# Patient Record
Sex: Male | Born: 1955
Health system: Southern US, Community
[De-identification: ages and names within clinical notes are randomized; demographics above are authoritative.]

## PROBLEM LIST (undated history)

## (undated) DIAGNOSIS — E039 Hypothyroidism, unspecified: Secondary | ICD-10-CM

## (undated) HISTORY — PX: SHOULDER SURGERY: SHX246

## (undated) HISTORY — PX: APPENDECTOMY: SHX54

## (undated) HISTORY — DX: Hypothyroidism, unspecified: E03.9

## (undated) HISTORY — PX: MOUTH SURGERY: SHX715

## (undated) HISTORY — PX: BACK SURGERY: SHX140

---

## 2004-04-08 ENCOUNTER — Ambulatory Visit (HOSPITAL_COMMUNITY): Admission: RE | Admit: 2004-04-08 | Discharge: 2004-04-09 | Payer: Self-pay | Admitting: Neurosurgery

## 2004-05-30 ENCOUNTER — Encounter: Admission: RE | Admit: 2004-05-30 | Discharge: 2004-05-30 | Payer: Self-pay | Admitting: *Deleted

## 2015-12-26 LAB — HM COLONOSCOPY

## 2019-02-28 DIAGNOSIS — M12811 Other specific arthropathies, not elsewhere classified, right shoulder: Secondary | ICD-10-CM | POA: Diagnosis not present

## 2019-03-24 DIAGNOSIS — Z125 Encounter for screening for malignant neoplasm of prostate: Secondary | ICD-10-CM | POA: Diagnosis not present

## 2019-03-24 DIAGNOSIS — E038 Other specified hypothyroidism: Secondary | ICD-10-CM | POA: Diagnosis not present

## 2019-03-24 DIAGNOSIS — Z0000 Encounter for general adult medical examination without abnormal findings: Secondary | ICD-10-CM | POA: Diagnosis not present

## 2019-03-24 DIAGNOSIS — Z6829 Body mass index (BMI) 29.0-29.9, adult: Secondary | ICD-10-CM | POA: Diagnosis not present

## 2019-03-24 DIAGNOSIS — E782 Mixed hyperlipidemia: Secondary | ICD-10-CM | POA: Diagnosis not present

## 2019-03-30 ENCOUNTER — Other Ambulatory Visit: Payer: Self-pay

## 2019-06-14 ENCOUNTER — Other Ambulatory Visit: Payer: Self-pay

## 2019-06-14 MED ORDER — LEVOTHYROXINE SODIUM 75 MCG PO TABS: 75.0000 ug | ORAL_TABLET | Freq: Every day | ORAL | 0 refills | Status: DC

## 2019-06-14 NOTE — Telephone Encounter (Signed)
Needs refill on synthroid 75 mcg sent to [facility]

## 2019-09-21 ENCOUNTER — Other Ambulatory Visit: Payer: Self-pay

## 2019-09-21 MED ORDER — LEVOTHYROXINE SODIUM 75 MCG PO TABS: 75.0000 ug | ORAL_TABLET | Freq: Every day | ORAL | 0 refills | Status: DC

## 2019-11-01 DIAGNOSIS — M25562 Pain in left knee: Secondary | ICD-10-CM | POA: Diagnosis not present

## 2019-11-01 DIAGNOSIS — M1712 Unilateral primary osteoarthritis, left knee: Secondary | ICD-10-CM | POA: Diagnosis not present

## 2019-11-01 DIAGNOSIS — G8929 Other chronic pain: Secondary | ICD-10-CM | POA: Diagnosis not present

## 2019-11-28 ENCOUNTER — Other Ambulatory Visit: Payer: Self-pay

## 2019-11-28 MED ORDER — LEVOTHYROXINE SODIUM 75 MCG PO TABS: 75.0000 ug | ORAL_TABLET | Freq: Every day | ORAL | 0 refills | Status: DC

## 2019-12-01 DIAGNOSIS — R0600 Dyspnea, unspecified: Secondary | ICD-10-CM | POA: Diagnosis not present

## 2019-12-01 DIAGNOSIS — J209 Acute bronchitis, unspecified: Secondary | ICD-10-CM | POA: Diagnosis not present

## 2020-04-26 ENCOUNTER — Encounter: Payer: Self-pay | Admitting: Physician Assistant

## 2020-04-26 ENCOUNTER — Ambulatory Visit (INDEPENDENT_AMBULATORY_CARE_PROVIDER_SITE_OTHER): Payer: PPO | Admitting: Physician Assistant

## 2020-04-26 ENCOUNTER — Other Ambulatory Visit: Payer: Self-pay

## 2020-04-26 VITALS — BP 118/76 | HR 87 | Temp 97.5°F | Ht 67.0 in | Wt 185.0 lb

## 2020-04-26 DIAGNOSIS — E782 Mixed hyperlipidemia: Secondary | ICD-10-CM

## 2020-04-26 DIAGNOSIS — E038 Other specified hypothyroidism: Secondary | ICD-10-CM | POA: Diagnosis not present

## 2020-04-26 DIAGNOSIS — Z23 Encounter for immunization: Secondary | ICD-10-CM | POA: Diagnosis not present

## 2020-04-26 DIAGNOSIS — Z0000 Encounter for general adult medical examination without abnormal findings: Secondary | ICD-10-CM | POA: Diagnosis not present

## 2020-04-26 DIAGNOSIS — Z125 Encounter for screening for malignant neoplasm of prostate: Secondary | ICD-10-CM | POA: Diagnosis not present

## 2020-04-26 NOTE — Patient Instructions (Signed)

## 2020-04-26 NOTE — Progress Notes (Signed)
Subjective:  Patient ID: Craig Francis, male    DOB: 1955-06-25  Age: 64 y.o. MRN: [id number]  Chief Complaint  Patient presents with   Medicare wellness Follow up hypothyroidism        HPI Encounter for general adult medical examination without abnormal findings  Physical ("At Risk" items are starred): Patient's last physical exam was 1 year ago .   Growth percentile SmartLinks can only be used for patients less than 21 years old.   SDOH Screenings   Alcohol Screen: Not on file  Depression (PHQ2-9): Low Risk    PHQ-2 Score: 0  Financial Resource Strain: Not on file  Food Insecurity: Not on file  Housing: Not on file  Physical Activity: Not on file  Social Connections: Not on file  Stress: Not on file  Tobacco Use: Low Risk    Smoking Tobacco Use: Never Smoker   Smokeless Tobacco Use: Never Used  Transportation Needs: Not on file    Fall Risk  04/26/2020 03/30/2019  Falls in the past year? 0 0  Comment - Emmi Telephone Survey: data to providers prior to load  Number falls in past yr: 0 -  Injury with Fall? 0 -  Risk for fall due to : No Fall Risks -    Depression screen PHQ 2/9 04/26/2020  Decreased Interest 0  Down, Depressed, Hopeless 0  PHQ - 2 Score 0    Functional Status Survey: Is the patient deaf or have difficulty hearing?: No Does the patient have difficulty seeing, even when wearing glasses/contacts?: No Does the patient have difficulty concentrating, remembering, or making decisions?: No Does the patient have difficulty walking or climbing stairs?: No Does the patient have difficulty dressing or bathing?: No Does the patient have difficulty doing errands alone such as visiting a doctor's office or shopping?: No   Safety: reviewed ;  Patient wears a seat belt. Patient's home has smoke detectors and carbon monoxide detectors. Dental Care: biannual cleanings, brushes and flosses daily. Ophthalmology/Optometry: due Hearing loss:  none   Current Outpatient Medications on File Prior to Visit  Medication Sig Dispense Refill   levothyroxine (SYNTHROID) 75 MCG tablet Take 1 tablet (75 mcg total) by mouth daily before breakfast. 90 tablet 0   No current facility-administered medications on file prior to visit.    Social Hx   Social History   Socioeconomic History   Marital status: Married    Spouse name: Not on file   Number of children: Not on file   Years of education: Not on file   Highest education level: Not on file  Occupational History   Not on file  Tobacco Use   Smoking status: Never Smoker   Smokeless tobacco: Never Used  Vaping Use   Vaping Use: Never used  Substance and Sexual Activity   Alcohol use: Yes    Alcohol/week: 1.0 standard drink    Types: 1 Cans of beer per week   Drug use: Never   Sexual activity: Not on file  Other Topics Concern   Not on file  Social History Narrative   Not on file   Social Determinants of Health   Financial Resource Strain: Not on file  Food Insecurity: Not on file  Transportation Needs: Not on file  Physical Activity: Not on file  Stress: Not on file  Social Connections: Not on file   Past Medical History:  Diagnosis Date   Hypothyroid    History reviewed. No pertinent family history.  Review of Systems CONSTITUTIONAL: Negative for chills, fatigue, fever, unintentional weight gain and unintentional weight loss.  E/N/T: Negative for ear pain, nasal congestion and sore throat.  CARDIOVASCULAR: Negative for chest pain, dizziness, palpitations and pedal edema.  RESPIRATORY: Negative for recent cough and dyspnea.  GASTROINTESTINAL: Negative for abdominal pain, acid reflux symptoms, constipation, diarrhea, nausea and vomiting.  MSK: Negative for arthralgias and myalgias.  INTEGUMENTARY: Negative for rash.  NEUROLOGICAL: Negative for dizziness and headaches.  PSYCHIATRIC: Negative for sleep disturbance and to question depression screen.   Negative for depression, negative for anhedonia.       Objective:  BP 118/76 (BP Location: Left Arm, Patient Position: Sitting, Cuff Size: Normal)    Pulse 87    Temp (!) 97.5 F (36.4 C) (Temporal)    Ht 5\' 7"  (1.702 m)    Wt 185 lb (83.9 kg)    SpO2 96%    BMI 28.98 kg/m   BP/Weight [id number]  Systolic BP 123456  Diastolic BP 76  Wt. (Lbs) 185  BMI 28.98    Physical Exam  No results found for: WBC, HGB, HCT, PLT, GLUCOSE, CHOL, TRIG, HDL, LDLDIRECT, LDLCALC, ALT, AST, NA, K, CL, CREATININE, BUN, CO2, TSH, PSA, INR, GLUF, HGBA1C, MICROALBUR PHYSICAL EXAM:   VS: BP 118/76 (BP Location: Left Arm, Patient Position: Sitting, Cuff Size: Normal)    Pulse 87    Temp (!) 97.5 F (36.4 C) (Temporal)    Ht 5\' 7"  (1.702 m)    Wt 185 lb (83.9 kg)    SpO2 96%    BMI 28.98 kg/m   GEN: Well nourished, well developed, in no acute distress  Neck: no JVD or masses - no thyromegaly Cardiac: RRR; no murmurs, rubs, or gallops,no edema -  Respiratory:  normal respiratory rate and pattern with no distress - normal breath sounds with no rales, rhonchi, wheezes or rubs   Skin: warm and dry, no rash  Neuro:  Alert and Oriented x 3,- CN II-Xii grossly intact Psych: euthymic mood, appropriate affect and demeanor    Assessment & Plan:  1. Annual physical exam - CBC with Differential/Platelet - Comprehensive metabolic panel - TSH - Lipid panel - PSA  2. Adult onset hypothyroidism - TSH  3. Mixed hyperlipidemia - CBC with Differential/Platelet - Comprehensive metabolic panel - Lipid panel  4. Prostate cancer screening - PSA  5. Need for prophylactic vaccination against Streptococcus pneumoniae (pneumococcus) - Pneumococcal conjugate vaccine 13-valent    No orders of the defined types were placed in this encounter.   These are the goals we discussed: Goals   None      This is a list of the screening recommended for you and due dates:  Health Maintenance  Topic Date Due     Hepatitis C: One time screening is recommended by Center for Disease Control  (CDC) for  adults born from [birth year] through 1967.   Never done   Colon Cancer Screening  12/25/2020   Pneumonia vaccines (2 of 2 - PPSV23) 04/26/2021   Tetanus Vaccine  03/18/2028   Flu Shot  Completed   COVID-19 Vaccine  Completed      AN INDIVIDUALIZED CARE PLAN: was established or reinforced today.   SELF MANAGEMENT: The patient and I together assessed ways to personally work towards obtaining the recommended goals  Support needs The patient and/or family needs were assessed and services were offered and not necessary at this time.    Follow-up: Return in about 1 year (around 04/26/2021)  for fasting physical.  Yetta Flock [facility] [phone]

## 2020-04-27 LAB — CBC WITH DIFFERENTIAL/PLATELET
Basophils Absolute: 0.1 10*3/uL (ref 0.0–0.2)
Basos: 1 %
EOS (ABSOLUTE): 0.2 10*3/uL (ref 0.0–0.4)
Eos: 3 %
Hematocrit: 46.2 % (ref 37.5–51.0)
Hemoglobin: 16.4 g/dL (ref 13.0–17.7)
Immature Grans (Abs): 0 10*3/uL (ref 0.0–0.1)
Immature Granulocytes: 0 %
Lymphocytes Absolute: 2.2 10*3/uL (ref 0.7–3.1)
Lymphs: 33 %
MCH: 31.8 pg (ref 26.6–33.0)
MCHC: 35.5 g/dL (ref 31.5–35.7)
MCV: 90 fL (ref 79–97)
Monocytes Absolute: 0.5 10*3/uL (ref 0.1–0.9)
Monocytes: 8 %
Neutrophils Absolute: 3.7 10*3/uL (ref 1.4–7.0)
Neutrophils: 55 %
Platelets: 265 10*3/uL (ref 150–450)
RBC: 5.16 x10E6/uL (ref 4.14–5.80)
RDW: 11.2 % — ABNORMAL LOW (ref 11.6–15.4)
WBC: 6.7 10*3/uL (ref 3.4–10.8)

## 2020-04-27 LAB — COMPREHENSIVE METABOLIC PANEL
ALT: 17 IU/L (ref 0–44)
AST: 20 IU/L (ref 0–40)
Albumin/Globulin Ratio: 1.7 (ref 1.2–2.2)
Albumin: 4.7 g/dL (ref 3.8–4.8)
Alkaline Phosphatase: 50 IU/L (ref 44–121)
BUN/Creatinine Ratio: 11 (ref 10–24)
BUN: 11 mg/dL (ref 8–27)
Bilirubin Total: 0.8 mg/dL (ref 0.0–1.2)
CO2: 22 mmol/L (ref 20–29)
Calcium: 9.8 mg/dL (ref 8.6–10.2)
Chloride: 101 mmol/L (ref 96–106)
Creatinine, Ser: 0.97 mg/dL (ref 0.76–1.27)
GFR calc Af Amer: 94 mL/min/{1.73_m2} (ref >59)
GFR calc non Af Amer: 81 mL/min/{1.73_m2} (ref >59)
Globulin, Total: 2.7 g/dL (ref 1.5–4.5)
Glucose: 104 mg/dL — ABNORMAL HIGH (ref 65–99)
Potassium: 4.4 mmol/L (ref 3.5–5.2)
Sodium: 139 mmol/L (ref 134–144)
Total Protein: 7.4 g/dL (ref 6.0–8.5)

## 2020-04-27 LAB — LIPID PANEL
Chol/HDL Ratio: 3.6 ratio (ref 0.0–5.0)
Cholesterol, Total: 218 mg/dL — ABNORMAL HIGH (ref 100–199)
HDL: 61 mg/dL (ref >39)
LDL Chol Calc (NIH): 139 mg/dL — ABNORMAL HIGH (ref 0–99)
Triglycerides: 103 mg/dL (ref 0–149)
VLDL Cholesterol Cal: 18 mg/dL (ref 5–40)

## 2020-04-27 LAB — TSH: TSH: 2.91 u[IU]/mL (ref 0.450–4.500)

## 2020-04-27 LAB — PSA: Prostate Specific Ag, Serum: 3.5 ng/mL (ref 0.0–4.0)

## 2020-04-27 LAB — CARDIOVASCULAR RISK ASSESSMENT

## 2020-05-01 ENCOUNTER — Other Ambulatory Visit: Payer: Self-pay

## 2020-05-01 MED ORDER — LEVOTHYROXINE SODIUM 75 MCG PO TABS: 75.0000 ug | ORAL_TABLET | Freq: Every day | ORAL | 0 refills | Status: DC

## 2020-05-04 ENCOUNTER — Other Ambulatory Visit: Payer: Self-pay | Admitting: Physician Assistant

## 2020-06-04 ENCOUNTER — Ambulatory Visit (INDEPENDENT_AMBULATORY_CARE_PROVIDER_SITE_OTHER): Payer: PPO | Admitting: Physician Assistant

## 2020-06-04 ENCOUNTER — Other Ambulatory Visit: Payer: Self-pay

## 2020-06-04 ENCOUNTER — Encounter: Payer: Self-pay | Admitting: Physician Assistant

## 2020-06-04 VITALS — BP 118/72 | HR 94 | Temp 96.9°F | Ht 67.0 in | Wt 184.4 lb

## 2020-06-04 DIAGNOSIS — K5730 Diverticulosis of large intestine without perforation or abscess without bleeding: Secondary | ICD-10-CM | POA: Diagnosis not present

## 2020-06-04 DIAGNOSIS — R102 Pelvic and perineal pain: Secondary | ICD-10-CM | POA: Diagnosis not present

## 2020-06-04 DIAGNOSIS — K449 Diaphragmatic hernia without obstruction or gangrene: Secondary | ICD-10-CM | POA: Diagnosis not present

## 2020-06-04 DIAGNOSIS — K8020 Calculus of gallbladder without cholecystitis without obstruction: Secondary | ICD-10-CM | POA: Diagnosis not present

## 2020-06-04 DIAGNOSIS — R1031 Right lower quadrant pain: Secondary | ICD-10-CM | POA: Diagnosis not present

## 2020-06-04 DIAGNOSIS — R300 Dysuria: Secondary | ICD-10-CM

## 2020-06-04 LAB — POCT URINALYSIS DIP (CLINITEK)
Bilirubin, UA: NEGATIVE
Blood, UA: NEGATIVE
Glucose, UA: NEGATIVE mg/dL
Ketones, POC UA: NEGATIVE mg/dL
Nitrite, UA: NEGATIVE
Spec Grav, UA: 1.025 (ref 1.010–1.025)
Urobilinogen, UA: 0.2 E.U./dL
pH, UA: 6 (ref 5.0–8.0)

## 2020-06-04 MED ORDER — METRONIDAZOLE 500 MG PO TABS: 500.0000 mg | ORAL_TABLET | Freq: Two times a day (BID) | ORAL | 0 refills | Status: DC

## 2020-06-04 MED ORDER — CIPROFLOXACIN HCL 500 MG PO TABS: 500.0000 mg | ORAL_TABLET | Freq: Two times a day (BID) | ORAL | 0 refills | Status: DC

## 2020-06-04 NOTE — Progress Notes (Signed)
Acute Office Visit  Subjective:    Patient ID: Craig Francis, male    DOB: 1956-01-28, 65 y.o.   MRN: [id number]  Chief Complaint  Patient presents with  . Abdominal Pain    HPI Patient is in today for abdominal pain - pt states that 8 days ago he began having pain in his lower abdomen, felt gassy, and uncomfortable - states that he seemed a bit constipated - after about 2 days he began to feel better but then this weekend the pain got worse again -  He has had a small amount of dysuria - no hematuria Denies nausea, vomiting, or diarrhea but his appetite has been decreased He is having small amount of bowel movements - no melena or hematochezia Denies fever, cough, or congestion  Past Medical History:  Diagnosis Date  . Hypothyroid     History reviewed. No pertinent surgical history.  History reviewed. No pertinent family history.  Social History   Socioeconomic History  . Marital status: Married    Spouse name: Not on file  . Number of children: Not on file  . Years of education: Not on file  . Highest education level: Not on file  Occupational History  . Not on file  Tobacco Use  . Smoking status: Never Smoker  . Smokeless tobacco: Never Used  Vaping Use  . Vaping Use: Never used  Substance and Sexual Activity  . Alcohol use: Yes    Alcohol/week: 1.0 standard drink    Types: 1 Cans of beer per week  . Drug use: Never  . Sexual activity: Not on file  Other Topics Concern  . Not on file  Social History Narrative  . Not on file   Social Determinants of Health   Financial Resource Strain: Not on file  Food Insecurity: Not on file  Transportation Needs: Not on file  Physical Activity: Not on file  Stress: Not on file  Social Connections: Not on file  Intimate Partner Violence: Not on file    Outpatient Medications Prior to Visit  Medication Sig Dispense Refill  . levothyroxine (SYNTHROID) 75 MCG tablet TAKE 1 TABLET(75 MCG) BY MOUTH DAILY BEFORE  AND BREAKFAST 90 tablet 0   No facility-administered medications prior to visit.    No Known Allergies  Review of Systems CONSTITUTIONAL:see J{O E/N/T: Negative for ear pain, nasal congestion and sore throat.  CARDIOVASCULAR: Negative for chest pain, dizziness, palpitations and pedal edema.  RESPIRATORY: Negative for recent cough and dyspnea.  GASTROINTESTINAL: see HPI INTEGUMENTARY: Negative for rash.          Objective:    Physical Exam PHYSICAL EXAM:   VS: BP 118/72 (BP Location: Left Arm, Patient Position: Sitting, Cuff Size: Normal)   Pulse 94   Temp (!) 96.9 F (36.1 C) (Temporal)   Ht 5\' 7"  (1.702 m)   Wt 184 lb 6.4 oz (83.6 kg)   SpO2 96%   BMI 28.88 kg/m   GEN: Well nourished, well developed, in no acute distress  Cardiac: RRR; no murmurs, rubs, or gallops,no edema -  Respiratory:  normal respiratory rate and pattern with no distress - normal breath sounds with no rales, rhonchi, wheezes or rubs GI: hyperactive bowel sounds noted - moderate tenderness to lower abdomen and suprapubic area --- testicular exam normal and no sign of hernia Skin: warm and dry, no rash  Psych: euthymic mood, appropriate affect and demeanor Office Visit on 06/04/2020  Component Date Value Ref Range Status  .  Color, UA 06/04/2020 yellow  yellow Final  . Clarity, UA 06/04/2020 clear  clear Final  . Glucose, UA 06/04/2020 negative  negative mg/dL Final  . Bilirubin, UA 06/04/2020 negative  negative Final  . Ketones, POC UA 06/04/2020 negative  negative mg/dL Final  . Spec Grav, UA 06/04/2020 1.025  1.010 - 1.025 Final  . Blood, UA 06/04/2020 negative  negative Final  . pH, UA 06/04/2020 6.0  5.0 - 8.0 Final  . POC PROTEIN,UA 06/04/2020 trace  negative, trace Final  . Urobilinogen, UA 06/04/2020 0.2  0.2 or 1.0 E.U./dL Final  . Nitrite, UA 06/04/2020 Negative  Negative Final  . Leukocytes, UA 06/04/2020 Trace* Negative Final    BP 118/72 (BP Location: Left Arm, Patient Position:  Sitting, Cuff Size: Normal)   Pulse 94   Temp (!) 96.9 F (36.1 C) (Temporal)   Ht 5\' 7"  (1.702 m)   Wt 184 lb 6.4 oz (83.6 kg)   SpO2 96%   BMI 28.88 kg/m  Wt Readings from Last 3 Encounters:  06/04/20 184 lb 6.4 oz (83.6 kg)  04/26/20 185 lb (83.9 kg)    Health Maintenance Due  Topic Date Due  . Hepatitis C Screening  Never done    There are no preventive care reminders to display for this patient.   Lab Results  Component Value Date   TSH 2.910 04/26/2020   Lab Results  Component Value Date   WBC 6.7 04/26/2020   HGB 16.4 04/26/2020   HCT 46.2 04/26/2020   MCV 90 04/26/2020   PLT 265 04/26/2020   Lab Results  Component Value Date   NA 139 04/26/2020   K 4.4 04/26/2020   CO2 22 04/26/2020   GLUCOSE 104 (H) 04/26/2020   BUN 11 04/26/2020   CREATININE 0.97 04/26/2020   BILITOT 0.8 04/26/2020   ALKPHOS 50 04/26/2020   AST 20 04/26/2020   ALT 17 04/26/2020   PROT 7.4 04/26/2020   ALBUMIN 4.7 04/26/2020   CALCIUM 9.8 04/26/2020   Lab Results  Component Value Date   CHOL 218 (H) 04/26/2020   Lab Results  Component Value Date   HDL 61 04/26/2020   Lab Results  Component Value Date   LDLCALC 139 (H) 04/26/2020   Lab Results  Component Value Date   TRIG 103 04/26/2020   Lab Results  Component Value Date   CHOLHDL 3.6 04/26/2020   No results found for: HGBA1C     Assessment & Plan:  1. Right lower quadrant abdominal pain - CBC with Differential/Platelet - Comprehensive metabolic panel - CT Abdomen Pelvis W Contrast  2. Dysuria - POCT URINALYSIS DIP (CLINITEK) - Urine Culture    Meds ordered this encounter  Medications  . ciprofloxacin (CIPRO) 500 MG tablet    Sig: Take 1 tablet (500 mg total) by mouth 2 (two) times daily.    Dispense:  20 tablet    Refill:  0    Order Specific Question:   Supervising Provider    AnswerRochel Brome [id number]  . metroNIDAZOLE (FLAGYL) 500 MG tablet    Sig: Take 1 tablet (500 mg total) by mouth 2  (two) times daily.    Dispense:  20 tablet    Refill:  0    Order Specific Question:   Supervising Provider    AnswerShelton Silvas    Orders Placed This Encounter  Procedures  . Urine Culture  . CT Abdomen Pelvis W Contrast  . CBC  with Differential/Platelet  . Comprehensive metabolic panel  . POCT URINALYSIS DIP (CLINITEK)    PT NOTIFIED LABWORK NORMAL - CT SHOWED DIVERTICULITIS - RX SENT FOR CIPRO AND FLAGYL - TO FOLLOW UP IF ANY SYMPTOMS CHANGE OR WORSEN Follow-up: Return if symptoms worsen or fail to improve.  An After Visit Summary was printed and given to the patient.  Yetta Flock [facility] [phone]

## 2020-06-06 LAB — URINE CULTURE: Organism ID, Bacteria: NO GROWTH

## 2020-07-30 ENCOUNTER — Emergency Department (HOSPITAL_COMMUNITY): Payer: PPO | Attending: Emergency Medicine

## 2020-07-30 ENCOUNTER — Emergency Department (HOSPITAL_COMMUNITY): Payer: PPO

## 2020-07-30 ENCOUNTER — Emergency Department (HOSPITAL_COMMUNITY)
Admission: EM | Admit: 2020-07-30 | Discharge: 2020-07-30 | Disposition: A | Discharge status: Home / Self Care | Payer: Worker's Compensation | Attending: Emergency Medicine | Admitting: Emergency Medicine

## 2020-07-30 ENCOUNTER — Other Ambulatory Visit: Payer: Self-pay

## 2020-07-30 ENCOUNTER — Encounter (HOSPITAL_COMMUNITY): Payer: Self-pay | Admitting: *Deleted

## 2020-07-30 DIAGNOSIS — R404 Transient alteration of awareness: Secondary | ICD-10-CM | POA: Diagnosis not present

## 2020-07-30 DIAGNOSIS — Z23 Encounter for immunization: Secondary | ICD-10-CM | POA: Diagnosis not present

## 2020-07-30 DIAGNOSIS — S51811A Laceration without foreign body of right forearm, initial encounter: Secondary | ICD-10-CM | POA: Diagnosis not present

## 2020-07-30 DIAGNOSIS — M2634 Vertical displacement of fully erupted tooth or teeth: Secondary | ICD-10-CM

## 2020-07-30 DIAGNOSIS — Z20822 Contact with and (suspected) exposure to covid-19: Secondary | ICD-10-CM | POA: Diagnosis not present

## 2020-07-30 DIAGNOSIS — W228XXA Striking against or struck by other objects, initial encounter: Secondary | ICD-10-CM | POA: Diagnosis not present

## 2020-07-30 DIAGNOSIS — S0993XA Unspecified injury of face, initial encounter: Secondary | ICD-10-CM | POA: Diagnosis present

## 2020-07-30 DIAGNOSIS — S01511A Laceration without foreign body of lip, initial encounter: Secondary | ICD-10-CM | POA: Diagnosis not present

## 2020-07-30 DIAGNOSIS — M2630 Unspecified anomaly of tooth position of fully erupted tooth or teeth: Secondary | ICD-10-CM | POA: Insufficient documentation

## 2020-07-30 DIAGNOSIS — R0902 Hypoxemia: Secondary | ICD-10-CM | POA: Diagnosis not present

## 2020-07-30 DIAGNOSIS — R4020 Unspecified coma: Secondary | ICD-10-CM | POA: Diagnosis not present

## 2020-07-30 DIAGNOSIS — S199XXA Unspecified injury of neck, initial encounter: Secondary | ICD-10-CM | POA: Diagnosis not present

## 2020-07-30 DIAGNOSIS — Y990 Civilian activity done for income or pay: Secondary | ICD-10-CM | POA: Diagnosis not present

## 2020-07-30 DIAGNOSIS — S0240CA Maxillary fracture, right side, initial encounter for closed fracture: Secondary | ICD-10-CM | POA: Insufficient documentation

## 2020-07-30 DIAGNOSIS — E039 Hypothyroidism, unspecified: Secondary | ICD-10-CM | POA: Diagnosis not present

## 2020-07-30 DIAGNOSIS — S02401B Maxillary fracture, unspecified, initial encounter for open fracture: Secondary | ICD-10-CM

## 2020-07-30 DIAGNOSIS — R9431 Abnormal electrocardiogram [ECG] [EKG]: Secondary | ICD-10-CM | POA: Diagnosis not present

## 2020-07-30 DIAGNOSIS — S0990XA Unspecified injury of head, initial encounter: Secondary | ICD-10-CM | POA: Diagnosis not present

## 2020-07-30 DIAGNOSIS — S0081XA Abrasion of other part of head, initial encounter: Secondary | ICD-10-CM

## 2020-07-30 DIAGNOSIS — S02401A Maxillary fracture, unspecified, initial encounter for closed fracture: Secondary | ICD-10-CM | POA: Diagnosis not present

## 2020-07-30 DIAGNOSIS — Z79899 Other long term (current) drug therapy: Secondary | ICD-10-CM | POA: Insufficient documentation

## 2020-07-30 DIAGNOSIS — R58 Hemorrhage, not elsewhere classified: Secondary | ICD-10-CM | POA: Diagnosis not present

## 2020-07-30 LAB — CBC WITH DIFFERENTIAL/PLATELET
Abs Immature Granulocytes: 0.06 10*3/uL (ref 0.00–0.07)
Basophils Absolute: 0.1 10*3/uL (ref 0.0–0.1)
Basophils Relative: 0 %
Eosinophils Absolute: 0 10*3/uL (ref 0.0–0.5)
Eosinophils Relative: 0 %
HCT: 43.6 % (ref 39.0–52.0)
Hemoglobin: 15.2 g/dL (ref 13.0–17.0)
Immature Granulocytes: 0 %
Lymphocytes Relative: 8 %
Lymphs Abs: 1.1 10*3/uL (ref 0.7–4.0)
MCH: 32.3 pg (ref 26.0–34.0)
MCHC: 34.9 g/dL (ref 30.0–36.0)
MCV: 92.8 fL (ref 80.0–100.0)
Monocytes Absolute: 0.6 10*3/uL (ref 0.1–1.0)
Monocytes Relative: 5 %
Neutro Abs: 12 10*3/uL — ABNORMAL HIGH (ref 1.7–7.7)
Neutrophils Relative %: 87 %
Platelets: 210 10*3/uL (ref 150–400)
RBC: 4.7 MIL/uL (ref 4.22–5.81)
RDW: 12.3 % (ref 11.5–15.5)
WBC: 13.9 10*3/uL — ABNORMAL HIGH (ref 4.0–10.5)
nRBC: 0 % (ref 0.0–0.2)

## 2020-07-30 LAB — RESP PANEL BY RT-PCR (FLU A&B, COVID) ARPGX2
Influenza A by PCR: NEGATIVE
Influenza B by PCR: NEGATIVE
SARS Coronavirus 2 by RT PCR: NEGATIVE

## 2020-07-30 LAB — BASIC METABOLIC PANEL
Anion gap: 7 (ref 5–15)
BUN: 10 mg/dL (ref 8–23)
CO2: 23 mmol/L (ref 22–32)
Calcium: 9 mg/dL (ref 8.9–10.3)
Chloride: 105 mmol/L (ref 98–111)
Creatinine, Ser: 0.81 mg/dL (ref 0.61–1.24)
GFR, Estimated: >60 mL/min (ref >60)
Glucose, Bld: 112 mg/dL — ABNORMAL HIGH (ref 70–99)
Potassium: 3.9 mmol/L (ref 3.5–5.1)
Sodium: 135 mmol/L (ref 135–145)

## 2020-07-30 MED ORDER — TETANUS-DIPHTH-ACELL PERTUSSIS 5-2.5-18.5 LF-MCG/0.5 IM SUSY
0.5000 mL | PREFILLED_SYRINGE | Freq: Once | INTRAMUSCULAR | Status: AC
  Administered 2020-07-30: 0.5 mL via INTRAMUSCULAR
  Filled 2020-07-30: qty 0.5

## 2020-07-30 MED ORDER — AMOXICILLIN-POT CLAVULANATE 875-125 MG PO TABS
1.0000 | ORAL_TABLET | Freq: Once | ORAL | Status: AC
  Administered 2020-07-30: 1 via ORAL
  Filled 2020-07-30: qty 1

## 2020-07-30 MED ORDER — OXYCODONE-ACETAMINOPHEN 5-325 MG PO TABS: 1.0000 | ORAL_TABLET | ORAL | 0 refills | Status: DC | PRN

## 2020-07-30 MED ORDER — AMOXICILLIN-POT CLAVULANATE 875-125 MG PO TABS: 1.0000 | ORAL_TABLET | Freq: Two times a day (BID) | ORAL | 0 refills | Status: DC

## 2020-07-30 MED ORDER — LIDOCAINE-EPINEPHRINE 1 %-1:100000 IJ SOLN
10.0000 mL | Freq: Once | INTRAMUSCULAR | Status: AC
  Administered 2020-07-30: 10 mL via INTRADERMAL
  Filled 2020-07-30: qty 1

## 2020-07-30 NOTE — ED Triage Notes (Signed)
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  The cable holding the pipe together broke, the pipe fell down to the ground, bounced up and hit him in the face.  LOC on sight with snoring rr - unknown how long, but possibly 3 min.  Oral trauma noted with missing parital. Pt was altered, but is now ao x 4.  Handgun given to security [place] the bridge.   VS stable.    cbg 113

## 2020-07-30 NOTE — ED Notes (Signed)
E-signature pad unavailable at time of pt discharge. This RN discussed discharge materials with pt and answered all pt questions. Pt stated understanding of discharge material. ? ?

## 2020-07-30 NOTE — ED Provider Notes (Signed)
[facility] EMERGENCY DEPARTMENT Provider Note   CSN: [id number] Arrival date & time: 07/30/20  1509     History Chief Complaint  Patient presents with  . Facial Injury    Craig Francis is a 65 y.o. male.  The history is provided by the patient. No language interpreter was used.  Facial Injury Mechanism of injury:  Direct blow Location:  Mouth and chin Mouth location:  Lip(s) Time since incident:  2 hours Pain details:    Quality:  Aching   Severity:  Moderate   Timing:  Constant   Progression:  Unchanged Relieved by:  Nothing Worsened by:  Nothing Ineffective treatments:  None tried Associated symptoms: headaches and loss of consciousness   Associated symptoms: no congestion, no difficulty breathing, no double vision, no epistaxis, no nausea, no neck pain, no rhinorrhea, no vomiting and no wheezing        Past Medical History:  Diagnosis Date  . Hypothyroid     Patient Active Problem List   Diagnosis Date Noted  . Right lower quadrant abdominal pain 06/04/2020  . Dysuria 06/04/2020  . Annual physical exam 04/26/2020  . Need for prophylactic vaccination against Streptococcus pneumoniae (pneumococcus) 04/26/2020  . Prostate cancer screening 04/26/2020  . Adult onset hypothyroidism 04/26/2020    History reviewed. No pertinent surgical history.     No family history on file.  Social History   Tobacco Use  . Smoking status: Never Smoker  . Smokeless tobacco: Never Used  Vaping Use  . Vaping Use: Never used  Substance Use Topics  . Alcohol use: Yes    Alcohol/week: 1.0 standard drink    Types: 1 Cans of beer per week  . Drug use: Never    Home Medications Prior to Admission medications   Medication Sig Start Date End Date Taking? Authorizing Provider  ciprofloxacin (CIPRO) 500 MG tablet Take 1 tablet (500 mg total) by mouth 2 (two) times daily. 06/04/20   Marge Duncans, PA-C  levothyroxine (SYNTHROID) 75 MCG tablet TAKE 1  TABLET(75 MCG) BY MOUTH DAILY BEFORE AND BREAKFAST 05/05/20   Marge Duncans, PA-C  metroNIDAZOLE (FLAGYL) 500 MG tablet Take 1 tablet (500 mg total) by mouth 2 (two) times daily. 06/04/20   Marge Duncans, PA-C    Allergies    Patient has no known allergies.  Review of Systems   Review of Systems  Constitutional: Negative for chills, fatigue and fever.  HENT: Negative for congestion, nosebleeds, rhinorrhea and voice change.   Eyes: Negative for double vision.  Respiratory: Negative for cough, chest tightness, shortness of breath and wheezing.   Cardiovascular: Negative for chest pain and palpitations.  Gastrointestinal: Negative for nausea and vomiting.  Musculoskeletal: Negative for back pain, neck pain and neck stiffness.  Skin: Positive for wound.  Neurological: Positive for loss of consciousness and headaches. Negative for dizziness, speech difficulty, weakness, light-headedness and numbness.  Psychiatric/Behavioral: Negative for agitation and confusion.  All other systems reviewed and are negative.   Physical Exam Updated Vital Signs BP (!) 162/84   Pulse 83   Temp 97.7 F (36.5 C) (Axillary)   Resp 15   Ht 5\' 7"  (1.702 m)   Wt 85.7 kg   SpO2 96%   BMI 29.60 kg/m   Physical Exam Vitals and nursing note reviewed.  Constitutional:      Appearance: He is well-developed. He is not ill-appearing.  HENT:     Head: Abrasion present.      Nose: No  congestion or rhinorrhea.     Mouth/Throat:     Mouth: Mucous membranes are moist. Injury and lacerations present.     Dentition: Abnormal dentition. Dental tenderness present.     Pharynx: No oropharyngeal exudate or posterior oropharyngeal erythema.   Eyes:     Extraocular Movements: Extraocular movements intact.     Conjunctiva/sclera: Conjunctivae normal.     Pupils: Pupils are equal, round, and reactive to light.  Neck:     Comments: In c collar Cardiovascular:     Rate and Rhythm: Normal rate and regular rhythm.      Pulses: Normal pulses.     Heart sounds: No murmur heard.   Pulmonary:     Effort: Pulmonary effort is normal. No respiratory distress.     Breath sounds: Normal breath sounds.  Abdominal:     Palpations: Abdomen is soft.     Tenderness: There is no abdominal tenderness. There is no guarding or rebound.  Musculoskeletal:        General: Tenderness present.       Arms:     Cervical back: Neck supple. No tenderness.  Skin:    General: Skin is warm and dry.     Capillary Refill: Capillary refill takes less than 2 seconds.     Findings: No erythema.  Neurological:     General: No focal deficit present.     Mental Status: He is alert.  Psychiatric:        Mood and Affect: Mood normal.              After repair:   ED Results / Procedures / Treatments   Labs (all labs ordered are listed, but only abnormal results are displayed) Labs Reviewed  CBC WITH DIFFERENTIAL/PLATELET - Abnormal; Notable for the following components:      Result Value   WBC 13.9 (*)    Neutro Abs 12.0 (*)    All other components within normal limits  BASIC METABOLIC PANEL - Abnormal; Notable for the following components:   Glucose, Bld 112 (*)    All other components within normal limits  RESP PANEL BY RT-PCR (FLU A&B, COVID) [id number]    EKG EKG Interpretation  Date/Time:  Monday July 30 2020 15:23:00 EDT Ventricular Rate:  86 PR Interval:    QRS Duration: 93 QT Interval:  389 QTC Calculation: 466 R Axis:   -19 Text Interpretation: Sinus rhythm Borderline left axis deviation RSR' in V1 or V2, probably normal variant When comapred to prior, similar apperance. No STEMI Confirmed by Antony Blackbird [phone]) on 07/30/2020 3:34:17 PM   Radiology CT Head Wo Contrast  Result Date: 07/30/2020 CLINICAL DATA:  Hit with pipe. EXAM: CT HEAD WITHOUT CONTRAST CT MAXILLOFACIAL WITHOUT CONTRAST CT CERVICAL SPINE WITHOUT CONTRAST TECHNIQUE: Multidetector CT imaging of the head, cervical spine, and  maxillofacial structures were performed using the standard protocol without intravenous contrast. Multiplanar CT image reconstructions of the cervical spine and maxillofacial structures were also generated. COMPARISON:  None. FINDINGS: CT HEAD FINDINGS Brain: No evidence of acute infarction, hemorrhage, hydrocephalus, extra-axial collection or mass lesion/mass effect. Vascular: No hyperdense vessel or unexpected calcification. Skull: Normal. Negative for fracture or focal lesion. Other: None. CT MAXILLOFACIAL FINDINGS Osseous: The mandible is unremarkable. However, the right lateral incisor of the maxilla appears to have been displaced slightly superiorly and has resulted in a nondisplaced fracture involving the right anterior maxillary wall. Fluid is noted in the right maxillary sinus consistent with hemorrhage. Orbits: Negative. No traumatic  or inflammatory finding. Sinuses: As noted above, fluid is noted in right maxillary sinus which most likely is due to nondisplaced fracture involving right anterior maxillary wall. Soft tissues: Large amount of contusion and soft tissue gas is seen overlying the right mandibular and maxillary region consistent with traumatic injury. CT CERVICAL SPINE FINDINGS Alignment: Normal. Skull base and vertebrae: No acute fracture. No primary bone lesion or focal pathologic process. Soft tissues and spinal canal: No prevertebral fluid or swelling. No visible canal hematoma. Disc levels:  Status post surgical anterior fusion of C6-7. Upper chest: Negative. Other: None. IMPRESSION: 1. Normal head CT. 2. However, the right lateral incisor of the maxilla appears to have been displaced slightly superiorly and has resulted in a nondisplaced fracture involving the right anterior maxillary wall. Fluid is noted in the right maxillary sinus consistent with hemorrhage. Large amount of contusion and soft tissue gas is seen overlying the right mandibular and maxillary region consistent with  traumatic injury. 3. No acute abnormality seen in the cervical spine. Electronically Signed   By: Marijo Conception M.D.   On: 07/30/2020 18:49   CT Cervical Spine Wo Contrast  Result Date: 07/30/2020 CLINICAL DATA:  Hit with pipe. EXAM: CT HEAD WITHOUT CONTRAST CT MAXILLOFACIAL WITHOUT CONTRAST CT CERVICAL SPINE WITHOUT CONTRAST TECHNIQUE: Multidetector CT imaging of the head, cervical spine, and maxillofacial structures were performed using the standard protocol without intravenous contrast. Multiplanar CT image reconstructions of the cervical spine and maxillofacial structures were also generated. COMPARISON:  None. FINDINGS: CT HEAD FINDINGS Brain: No evidence of acute infarction, hemorrhage, hydrocephalus, extra-axial collection or mass lesion/mass effect. Vascular: No hyperdense vessel or unexpected calcification. Skull: Normal. Negative for fracture or focal lesion. Other: None. CT MAXILLOFACIAL FINDINGS Osseous: The mandible is unremarkable. However, the right lateral incisor of the maxilla appears to have been displaced slightly superiorly and has resulted in a nondisplaced fracture involving the right anterior maxillary wall. Fluid is noted in the right maxillary sinus consistent with hemorrhage. Orbits: Negative. No traumatic or inflammatory finding. Sinuses: As noted above, fluid is noted in right maxillary sinus which most likely is due to nondisplaced fracture involving right anterior maxillary wall. Soft tissues: Large amount of contusion and soft tissue gas is seen overlying the right mandibular and maxillary region consistent with traumatic injury. CT CERVICAL SPINE FINDINGS Alignment: Normal. Skull base and vertebrae: No acute fracture. No primary bone lesion or focal pathologic process. Soft tissues and spinal canal: No prevertebral fluid or swelling. No visible canal hematoma. Disc levels:  Status post surgical anterior fusion of C6-7. Upper chest: Negative. Other: None. IMPRESSION: 1. Normal  head CT. 2. However, the right lateral incisor of the maxilla appears to have been displaced slightly superiorly and has resulted in a nondisplaced fracture involving the right anterior maxillary wall. Fluid is noted in the right maxillary sinus consistent with hemorrhage. Large amount of contusion and soft tissue gas is seen overlying the right mandibular and maxillary region consistent with traumatic injury. 3. No acute abnormality seen in the cervical spine. Electronically Signed   By: Marijo Conception M.D.   On: 07/30/2020 18:49   CT Maxillofacial Wo Contrast  Result Date: 07/30/2020 CLINICAL DATA:  Hit with pipe. EXAM: CT HEAD WITHOUT CONTRAST CT MAXILLOFACIAL WITHOUT CONTRAST CT CERVICAL SPINE WITHOUT CONTRAST TECHNIQUE: Multidetector CT imaging of the head, cervical spine, and maxillofacial structures were performed using the standard protocol without intravenous contrast. Multiplanar CT image reconstructions of the cervical spine and maxillofacial structures were  also generated. COMPARISON:  None. FINDINGS: CT HEAD FINDINGS Brain: No evidence of acute infarction, hemorrhage, hydrocephalus, extra-axial collection or mass lesion/mass effect. Vascular: No hyperdense vessel or unexpected calcification. Skull: Normal. Negative for fracture or focal lesion. Other: None. CT MAXILLOFACIAL FINDINGS Osseous: The mandible is unremarkable. However, the right lateral incisor of the maxilla appears to have been displaced slightly superiorly and has resulted in a nondisplaced fracture involving the right anterior maxillary wall. Fluid is noted in the right maxillary sinus consistent with hemorrhage. Orbits: Negative. No traumatic or inflammatory finding. Sinuses: As noted above, fluid is noted in right maxillary sinus which most likely is due to nondisplaced fracture involving right anterior maxillary wall. Soft tissues: Large amount of contusion and soft tissue gas is seen overlying the right mandibular and maxillary  region consistent with traumatic injury. CT CERVICAL SPINE FINDINGS Alignment: Normal. Skull base and vertebrae: No acute fracture. No primary bone lesion or focal pathologic process. Soft tissues and spinal canal: No prevertebral fluid or swelling. No visible canal hematoma. Disc levels:  Status post surgical anterior fusion of C6-7. Upper chest: Negative. Other: None. IMPRESSION: 1. Normal head CT. 2. However, the right lateral incisor of the maxilla appears to have been displaced slightly superiorly and has resulted in a nondisplaced fracture involving the right anterior maxillary wall. Fluid is noted in the right maxillary sinus consistent with hemorrhage. Large amount of contusion and soft tissue gas is seen overlying the right mandibular and maxillary region consistent with traumatic injury. 3. No acute abnormality seen in the cervical spine. Electronically Signed   By: Marijo Conception M.D.   On: 07/30/2020 18:49    Procedures .Nerve Block  Date/Time: 07/30/2020 10:35 PM Performed by: Courtney Paris, MD Authorized by: Courtney Paris, MD   Consent:    Consent obtained:  Written   Consent given by:  Patient   Risks, benefits, and alternatives were discussed: yes     Risks discussed:  Infection, nerve damage, pain, swelling, unsuccessful block and allergic reaction   Alternatives discussed:  No treatment Universal protocol:    Procedure explained and questions answered to patient or proxy's satisfaction: yes     Imaging studies available: yes     Immediately prior to procedure, a time out was called: yes     Patient identity confirmed:  Verbally with patient and provided demographic data Indications:    Indications:  Procedural anesthesia Location:    Body area:  Head   Head nerve blocked: mental nerve.   Laterality:  Right Skin anesthesia:    Skin anesthesia method:  Local infiltration   Local anesthetic:  Lidocaine 2% WITH epi Procedure details:    Block needle  gauge:  27 G Post-procedure details:    Outcome:  Anesthesia achieved   Procedure completion:  Tolerated .Marland KitchenLaceration Repair  Date/Time: 07/30/2020 10:37 PM Performed by: Courtney Paris, MD Authorized by: Courtney Paris, MD   Consent:    Consent obtained:  Written   Consent given by:  Patient   Risks, benefits, and alternatives were discussed: yes     Risks discussed:  Infection, pain, poor cosmetic result, poor wound healing, need for additional repair and nerve damage   Alternatives discussed:  No treatment Universal protocol:    Procedure explained and questions answered to patient or proxy's satisfaction: yes     Immediately prior to procedure, a time out was called: yes     Patient identity confirmed:  Verbally with patient and arm  band Anesthesia:    Anesthesia method: nerve block. Laceration details:    Location:  Lip   Lip location:  Lower lip, full thickness   Vermilion border involved: yes     Height of lip laceration:  Up to half vertical height   Length (cm):  4   Depth (mm):  3 Pre-procedure details:    Preparation:  Patient was prepped and draped in usual sterile fashion and imaging obtained to evaluate for foreign bodies Exploration:    Wound exploration: wound explored through full range of motion and entire depth of wound visualized     Wound extent: no muscle damage noted   Treatment:    Area cleansed with:  Saline   Amount of cleaning:  Standard   Irrigation solution:  Sterile saline   Irrigation method:  Syringe   Debridement:  Moderate Skin repair:    Repair method:  Sutures   Suture size:  4-0   Wound skin closure material used: vicryl.   Suture technique:  Simple interrupted   Number of sutures:  9 Approximation:    Approximation:  Close   Vermilion border well-aligned: yes   Repair type:    Repair type:  Complex Post-procedure details:    Dressing:  Open (no dressing)   Procedure completion:  Tolerated     Medications  Ordered in ED Medications  Tdap (BOOSTRIX) injection 0.5 mL (0.5 mLs Intramuscular Given 07/30/20 1923)  lidocaine-EPINEPHrine (XYLOCAINE W/EPI) 1 %-1:100000 (with pres) injection 10 mL (10 mLs Intradermal Given by Other 07/30/20 2200)  amoxicillin-clavulanate (AUGMENTIN) 875-125 MG per tablet 1 tablet (1 tablet Oral Given 07/30/20 2230)    ED Course  I have reviewed the triage vital signs and the nursing notes.  Pertinent labs & imaging results that were available during my care of the patient were reviewed by me and considered in my medical decision making (see chart for details).    MDM Rules/Calculators/A&P                          Craig Francis is a 65 y.o. male with a past medical history significant for hypothyroidism who presents for head and face injury.  Patient reports that he was working on a job site when a large, 6 [employer] pipe that was being picked up by a crane was dropped as the cable snapped and bounced to the ground and then bounced up striking the patient in the jawline face.  The patient's boss arrived and was able to write further history as the patient does not remember all of it but the patient was knocked out for several minutes.  Patient had partial dentures in both upper and lower and the have broken.  Patient also is having moderate pain in the left ear and left posterior jaw.  Patient is reporting some mild headache and was placed in his to a c-collar by EMS on the way here.  He is denying any pain from his neck down.  On exam, lungs clear and chest nontender.  Abdomen nontender.  No focal neurologic deficits, initial exam.  Normal sensation and strength in all extremities.  Normal sensation of the face.  He has tenderness in his lower jaw as well as tenderness in both temporal areas.  He does have what appears to be hemotympanum on the left side compared to the right.  Patient has tenderness in the left jaw and central jaw.  He has a 3  cm laceration to his right  lower lip and some dental trauma.  Normal extraocular movements and pupil exam.  No nasal septal hematoma or nose tenderness.  Remains in a c-collar.  Clinically I have a high concern for TM injury on the left more than right given this injury into his jaw.  We will get CT of the head, face, and neck.  We will get screening labs and Covid test in case he needs to go to the OR for management.  Airway remains intact.  We will update his tetanus as he is not of his last tetanus shot.  Anticipate reassessment after imaging.  7:06 PM CT imaging returned in the C-spine and head did not show any intracranial bleeding.  No evidence of TM injury.  What is seen however is the right lateral incisor on the maxilla displaced superiorly and caused a fracture in the right anterior maxillary wall.  There is hemorrhage and contusion and soft tissue gas.  Collar was removed and jaw was more thoroughly examined.  Patient appears to have a laceration/abrasion to his right cheek and has the large laceration to his lip.  His right upper incisor is very loose and movable and tender.  We will call the facial trauma team to discuss further management  I spoke to the facial trauma team and they feel the lip laceration is suitable for emergency department repair.  They did request we speak to dentistry for the tooth injury.  Dentistry says he can start Augmentin and follow-up with his oral surgeon/dentist in the next several days.  A mental block was utilized for anesthesia of the lip and the laceration was repaired.  It was a complex repair.  See post repair photo.  Patient will follow up with ENT for the lip injury, his oral surgeon/dentist for the maxillary fracture and tooth injury, and his PCP for other follow-up.  Patient will be discharged with prescription for pain medicine and antibiotics.    Final Clinical Impression(s) / ED Diagnoses Final diagnoses:  Lip laceration, initial encounter  Abrasion of cheek,  initial encounter  Open fracture of maxillary sinus, initial encounter ([place])  Intrusion of tooth  Skin tear of right forearm without complication, initial encounter    Rx / DC Orders ED Discharge Orders         Ordered    amoxicillin-clavulanate (AUGMENTIN) 875-125 MG tablet  2 times daily        07/30/20 2233    oxyCODONE-acetaminophen (PERCOCET/ROXICET) 5-325 MG tablet  Every 4 hours PRN        07/30/20 2233          Clinical Impression: 1. Lip laceration, initial encounter   2. Abrasion of cheek, initial encounter   3. Open fracture of maxillary sinus, initial encounter ([place])   4. Intrusion of tooth   5. Skin tear of right forearm without complication, initial encounter     Disposition: Discharge  Condition: Good  I have discussed the results, Dx and Tx plan with the pt(& family if present). He/she/they expressed understanding and agree(s) with the plan. Discharge instructions discussed at great length. Strict return precautions discussed and pt &/or family have verbalized understanding of the instructions. No further questions at time of discharge.    New Prescriptions   AMOXICILLIN-CLAVULANATE (AUGMENTIN) 875-125 MG TABLET    Take 1 tablet by mouth 2 (two) times daily.   OXYCODONE-ACETAMINOPHEN (PERCOCET/ROXICET) 5-325 MG TABLET    Take 1 tablet by mouth every 4 (four) hours  as needed for severe pain.    Follow Up: your dentist/oral surgeon     Wallace Going, DO [place] [address] [phone]   with the plastics facial trauma team  Marge Duncans, [address] [place] [address] [phone]        Endy Easterly, Gwenyth Allegra, MD 07/30/20 [phone]

## 2020-07-30 NOTE — ED Notes (Signed)
RN wrapped skin tear injury on pt's R arm

## 2020-07-30 NOTE — Discharge Instructions (Signed)
Your images today showed the injury to the upper mouth with the tooth going partially into the sinus causing some of your pain.  You also had a complex laceration to the lip which we repaired.  Please follow-up with plastic facial trauma team for the lip laceration and with your dentist/oral surgeon for the tooth and upper face injury.  Please rest and stay hydrated and use the pain medicine if needed.  Please take the antibiotics to prevent infection.  If any symptoms change or worsen, please return to the nearest emergency department.

## 2020-10-29 ENCOUNTER — Other Ambulatory Visit: Payer: Self-pay | Admitting: Physician Assistant

## 2020-12-29 ENCOUNTER — Telehealth: Payer: Self-pay

## 2020-12-29 NOTE — Telephone Encounter (Signed)
Attempted to schedule patient for annual wellness visit. Patient kindly declined, states he would rather complete visit in the office.  Craig Francis

## 2021-02-08 ENCOUNTER — Other Ambulatory Visit: Payer: Self-pay | Admitting: Family Medicine

## 2021-02-11 DIAGNOSIS — D485 Neoplasm of uncertain behavior of skin: Secondary | ICD-10-CM | POA: Diagnosis not present

## 2021-02-11 DIAGNOSIS — L853 Xerosis cutis: Secondary | ICD-10-CM | POA: Diagnosis not present

## 2021-02-11 DIAGNOSIS — L570 Actinic keratosis: Secondary | ICD-10-CM | POA: Diagnosis not present

## 2021-03-01 DIAGNOSIS — C44519 Basal cell carcinoma of skin of other part of trunk: Secondary | ICD-10-CM | POA: Diagnosis not present
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
## 2021-05-01 ENCOUNTER — Other Ambulatory Visit: Payer: Self-pay

## 2021-05-01 ENCOUNTER — Ambulatory Visit (INDEPENDENT_AMBULATORY_CARE_PROVIDER_SITE_OTHER): Payer: PPO | Admitting: Physician Assistant

## 2021-05-01 ENCOUNTER — Encounter: Payer: Self-pay | Admitting: Physician Assistant

## 2021-05-01 ENCOUNTER — Telehealth: Payer: Self-pay | Admitting: Physician Assistant

## 2021-05-01 VITALS — BP 120/82 | HR 80 | Temp 97.2°F | Ht 67.0 in | Wt 189.2 lb

## 2021-05-01 DIAGNOSIS — Z0000 Encounter for general adult medical examination without abnormal findings: Secondary | ICD-10-CM | POA: Diagnosis not present

## 2021-05-01 DIAGNOSIS — Z125 Encounter for screening for malignant neoplasm of prostate: Secondary | ICD-10-CM | POA: Diagnosis not present

## 2021-05-01 DIAGNOSIS — Z23 Encounter for immunization: Secondary | ICD-10-CM

## 2021-05-01 DIAGNOSIS — E782 Mixed hyperlipidemia: Secondary | ICD-10-CM | POA: Diagnosis not present

## 2021-05-01 DIAGNOSIS — E038 Other specified hypothyroidism: Secondary | ICD-10-CM | POA: Diagnosis not present

## 2021-05-01 DIAGNOSIS — R911 Solitary pulmonary nodule: Secondary | ICD-10-CM

## 2021-05-01 NOTE — Progress Notes (Signed)
Subjective:   Craig Francis is a 64 y.o. male who presents for an Initial Medicare Annual Wellness Visit.  Review of Systems    CONSTITUTIONAL: Negative for chills, fatigue, fever, unintentional weight gain and unintentional weight loss.  E/N/T: Negative for ear pain, nasal congestion and sore throat.  CARDIOVASCULAR: Negative for chest pain, dizziness, palpitations and pedal edema.  RESPIRATORY: Negative for recent cough and dyspnea.  GASTROINTESTINAL: Negative for abdominal pain, acid reflux symptoms, constipation, diarrhea, nausea and vomiting.  MSK: Negative for arthralgias and myalgias.  INTEGUMENTARY: Negative for rash.  NEUROLOGICAL: Negative for dizziness and headaches.  PSYCHIATRIC: Negative for sleep disturbance and to question depression screen.  Negative for depression, negative for anhedonia.     Pt would like pneumo 23 and flu shot today He is due for labwork - for history of hypothyroidism and for history of elevated lipids Pt will be due for follow up chest CT with no contrast for pulmonary nodule noted 1/22 Cardiac Risk Factors include: advanced age (>32men, >2 women);none     Objective:    Today's Vitals   05/01/21 0810  BP: 120/82  Pulse: 80  Temp: (!) 97.2 F (36.2 C)  TempSrc: Temporal  SpO2: 94%  Weight: 189 lb 3.2 oz (85.8 kg)  Height: 5\' 7"  (1.702 m)   Body mass index is 29.63 kg/m.  Advanced Directives 05/01/2021 07/30/2020 04/26/2020  Does Patient Have a Medical Advance Directive? Yes No No  Type of Advance Directive Living will - -  Does patient want to make changes to medical advance directive? No - Patient declined - Yes (ED - Information included in AVS)  Would patient like information on creating a medical advance directive? - No - Patient declined Yes (ED - Information included in AVS)    Current Medications (verified) Outpatient Encounter Medications as of 05/01/2021  Medication Sig   levothyroxine (SYNTHROID) 75 MCG tablet TAKE 1  TABLET(75 MCG) BY MOUTH DAILY BEFORE AND BREAKFAST   [DISCONTINUED] amoxicillin-clavulanate (AUGMENTIN) 875-125 MG tablet Take 1 tablet by mouth 2 (two) times daily.   [DISCONTINUED] ciprofloxacin (CIPRO) 500 MG tablet Take 1 tablet (500 mg total) by mouth 2 (two) times daily.   [DISCONTINUED] metroNIDAZOLE (FLAGYL) 500 MG tablet Take 1 tablet (500 mg total) by mouth 2 (two) times daily.   [DISCONTINUED] oxyCODONE-acetaminophen (PERCOCET/ROXICET) 5-325 MG tablet Take 1 tablet by mouth every 4 (four) hours as needed for severe pain.   No facility-administered encounter medications on file as of 05/01/2021.    Allergies (verified) Patient has no known allergies.   History: Past Medical History:  Diagnosis Date   Hypothyroid    History reviewed. No pertinent surgical history. History reviewed. No pertinent family history. Social History   Socioeconomic History   Marital status: Married    Spouse name: Not on file   Number of children: Not on file   Years of education: Not on file   Highest education level: Not on file  Occupational History   Not on file  Tobacco Use   Smoking status: Never   Smokeless tobacco: Never  Vaping Use   Vaping Use: Never used  Substance and Sexual Activity   Alcohol use: Yes    Alcohol/week: 1.0 standard drink    Types: 1 Cans of beer per week   Drug use: Never   Sexual activity: Not on file  Other Topics Concern   Not on file  Social History Narrative   Not on file   Social Determinants of  Health   Financial Resource Strain: Low Risk    Difficulty of Paying Living Expenses: Not hard at all  Food Insecurity: No Food Insecurity   Worried About [place] in the Last Year: Never true   Ran Out of Food in the Last Year: Never true  Transportation Needs: No Transportation Needs   Lack of Transportation (Medical): No   Lack of Transportation (Non-Medical): No  Physical Activity: Sufficiently Active   Days of Exercise per Week: 5 days    Minutes of Exercise per Session: 60 min  Stress: No Stress Concern Present   Feeling of Stress : Not at all  Social Connections: Moderately Integrated   Frequency of Communication with Friends and Family: Once a week   Frequency of Social Gatherings with Friends and Family: More than three times a week   Attends Religious Services: 1 to 4 times per year   Active Member of [employer] or Organizations: No   Attends Archivist Meetings: Never   Marital Status: Married    Tobacco Counseling Counseling given: Not Answered   Clinical Intake:                 Diabetic?N/A         Activities of Daily Living In your present state of health, do you have any difficulty performing the following activities: 05/01/2021  Hearing? N  Vision? N  Difficulty concentrating or making decisions? N  Walking or climbing stairs? N  Dressing or bathing? N  Doing errands, shopping? N  Preparing Food and eating ? N  Using the Toilet? N  In the past six months, have you accidently leaked urine? N  Do you have problems with loss of bowel control? N  Managing your Medications? N  Managing your Finances? N  Housekeeping or managing your Housekeeping? N  Some recent data might be hidden    Patient Care Team: Craig Francis, Craig Francis as PCP - General (Physician Assistant)  Indicate any recent Medical Services you may have received from other than [facility] providers in the past year (date may be approximate). PHYSICAL EXAM:   VS: BP 120/82 (BP Location: Left Arm, Patient Position: Sitting, Cuff Size: Large)    Pulse 80    Temp (!) 97.2 F (36.2 C) (Temporal)    Ht 5\' 7"  (1.702 m)    Wt 189 lb 3.2 oz (85.8 kg)    SpO2 94%    BMI 29.63 kg/m   GEN: Well nourished, well developed, in no acute distress  Cardiac: RRR; no murmurs, rubs, or gallops,no edema -  Respiratory:  normal respiratory rate and pattern with no distress - normal breath sounds with no rales, rhonchi, wheezes or rubs GI:  normal bowel sounds, no masses or tenderness MS: no deformity or atrophy  Skin: warm and dry, no rash  Psych: euthymic mood, appropriate affect and demeanor     Assessment:   This is a routine wellness examination for Craig Francis.  Hearing/Vision screen No results found.  Dietary issues and exercise activities discussed: Current Exercise Habits: Home exercise routine, Type of exercise: walking, Time (Minutes): 60, Frequency (Times/Week): 5, Weekly Exercise (Minutes/Week): 300, Intensity: Mild, Exercise limited by: None identified   Goals Addressed   None   Depression Screen PHQ 2/9 Scores 05/01/2021 04/26/2020  PHQ - 2 Score 0 0    Fall Risk Fall Risk  05/01/2021 04/26/2020 03/30/2019  Falls in the past year? 0 0 0  Comment - - Emmi Telephone Survey: data  to providers prior to load  Number falls in past yr: 0 0 -  Injury with Fall? 0 0 -  Risk for fall due to : No Fall Risks No Fall Risks -  Follow up Falls evaluation completed;Falls prevention discussed - -    FALL RISK PREVENTION PERTAINING TO THE HOME:  Any stairs in or around the home? Yes  If so, are there any without handrails? Yes  Home free of loose throw rugs in walkways, pet beds, electrical cords, etc? Yes  Adequate lighting in your home to reduce risk of falls? Yes   ASSISTIVE DEVICES UTILIZED TO PREVENT FALLS:  Life alert? No  Use of a cane, walker or w/c? No  Grab bars in the bathroom? No  Shower chair or bench in shower? No  Elevated toilet seat or a handicapped toilet? No   TIMED UP AND GO:  Was the test performed? Yes .  Length of time to ambulate 10 feet: 20 sec.   Gait steady and fast without use of assistive device  Cognitive Function:     6CIT Screen 05/01/2021 04/26/2020  What Year? 0 points 0 points  What month? 0 points 0 points  What time? 0 points 0 points  Count back from 20 0 points 0 points  Months in reverse 0 points 0 points  Repeat phrase 0 points 0 points  Total Score 0 0     Immunizations Immunization History  Administered Date(s) Administered   Fluad Quad(high Dose 65+) 05/01/2021   Influenza-Unspecified 03/01/2020   Moderna Sars-Covid-2 Vaccination 06/17/2019, 07/15/2019, 03/01/2020   Pneumococcal Conjugate-13 04/26/2020   Pneumococcal Polysaccharide-23 05/01/2021   Td 03/18/2018   Tdap 07/30/2020    TDAP status: Up to date  Flu Vaccine status: Completed at today's visit  Pneumococcal vaccine status: Completed during today's visit.  Covid-19 vaccine status: Completed vaccines  Qualifies for Shingles Vaccine? Yes   Zostavax completed No   Shingrix Completed?: Yes  Screening Tests Health Maintenance  Topic Date Due   Zoster Vaccines- Shingrix (1 of 2) Never done   COLONOSCOPY (Pts 45-89yrs Insurance coverage will need to be confirmed)  10/31/2022 (Originally 12/25/2020)   TETANUS/TDAP  07/31/2030   Pneumonia Vaccine 38+ Years old  Completed   INFLUENZA VACCINE  Completed   HPV VACCINES  Aged Out   COVID-19 Vaccine  Discontinued   Hepatitis C Screening  Discontinued    Health Maintenance  Health Maintenance Due  Topic Date Due   Zoster Vaccines- Shingrix (1 of 2) Never done    Colorectal cancer screening: Referral to GI placed appointment for June of 2023. Pt aware the office will call re: appt.  Lung Cancer Screening: (Low Dose CT Chest recommended if Age 67-80 years, 30 pack-year currently smoking OR have quit w/in 15years.) does not qualify.   Lung Cancer Screening Referral: no but due for repeat chest CT for prior abnormal CT  Additional Screening:  Hepatitis C Screening: does not qualify; Completed N/A  Vision Screening: Recommended annual ophthalmology exams for early detection of glaucoma and other disorders of the eye. Is the patient up to date with their annual eye exam?  Yes  Who is the provider or what is the name of the office in which the patient attends annual eye exams? [facility] If pt is not established  with a provider, would they like to be referred to a provider to establish care? No .   Dental Screening: Recommended annual dental exams for proper oral hygiene  Community Resource Referral / Chronic Care Management: CRR required this visit?  No   CCM required this visit?  No      Plan:     I have personally reviewed and noted the following in the patients chart:   Medical and social history Use of alcohol, tobacco or illicit drugs  Current medications and supplements including opioid prescriptions. Patient is not currently taking opioid prescriptions. Functional ability and status Nutritional status Physical activity Advanced directives List of other physicians Hospitalizations, surgeries, and ER visits in previous 12 months Vitals Screenings to include cognitive, depression, and falls Referrals and appointments  In addition, I have reviewed and discussed with patient certain preventive protocols, quality metrics, and best practice recommendations. A written personalized care plan for preventive services as well as general preventive health recommendations were provided to patient.   Labwork pending Flu and pneumo 23 given Chest CT ordered  SARA R Abegail Kloeppel, PA-C   05/01/2021

## 2021-05-01 NOTE — Telephone Encounter (Signed)
° °  Craig Francis has been scheduled for the following appointment:  WHAT: CHEST CT WHERE: [facility] DATE: 06/03/21 TIME: 11:00 AM ARRIVAL TIME  Patient has been made aware. SPOKE TO PT'S WIFE

## 2021-05-02 LAB — COMPREHENSIVE METABOLIC PANEL
ALT: 16 IU/L (ref 0–44)
AST: 22 IU/L (ref 0–40)
Albumin/Globulin Ratio: 1.7 (ref 1.2–2.2)
Albumin: 4.4 g/dL (ref 3.8–4.8)
Alkaline Phosphatase: 47 IU/L (ref 44–121)
BUN/Creatinine Ratio: 12 (ref 10–24)
BUN: 11 mg/dL (ref 8–27)
Bilirubin Total: 0.6 mg/dL (ref 0.0–1.2)
CO2: 23 mmol/L (ref 20–29)
Calcium: 9.4 mg/dL (ref 8.6–10.2)
Chloride: 101 mmol/L (ref 96–106)
Creatinine, Ser: 0.94 mg/dL (ref 0.76–1.27)
Globulin, Total: 2.6 g/dL (ref 1.5–4.5)
Glucose: 96 mg/dL (ref 70–99)
Potassium: 4.4 mmol/L (ref 3.5–5.2)
Sodium: 138 mmol/L (ref 134–144)
Total Protein: 7 g/dL (ref 6.0–8.5)
eGFR: 89 mL/min/{1.73_m2} (ref >59)

## 2021-05-02 LAB — TSH: TSH: 1.61 u[IU]/mL (ref 0.450–4.500)

## 2021-05-02 LAB — CBC WITH DIFFERENTIAL/PLATELET
Basophils Absolute: 0.1 10*3/uL (ref 0.0–0.2)
Basos: 1 %
EOS (ABSOLUTE): 0.2 10*3/uL (ref 0.0–0.4)
Eos: 4 %
Hematocrit: 44.8 % (ref 37.5–51.0)
Hemoglobin: 16.1 g/dL (ref 13.0–17.7)
Immature Grans (Abs): 0 10*3/uL (ref 0.0–0.1)
Immature Granulocytes: 1 %
Lymphocytes Absolute: 2 10*3/uL (ref 0.7–3.1)
Lymphs: 35 %
MCH: 32.2 pg (ref 26.6–33.0)
MCHC: 35.9 g/dL — ABNORMAL HIGH (ref 31.5–35.7)
MCV: 90 fL (ref 79–97)
Monocytes Absolute: 0.5 10*3/uL (ref 0.1–0.9)
Monocytes: 9 %
Neutrophils Absolute: 2.9 10*3/uL (ref 1.4–7.0)
Neutrophils: 50 %
Platelets: 234 10*3/uL (ref 150–450)
RBC: 5 x10E6/uL (ref 4.14–5.80)
RDW: 11.6 % (ref 11.6–15.4)
WBC: 5.7 10*3/uL (ref 3.4–10.8)

## 2021-05-02 LAB — LIPID PANEL
Chol/HDL Ratio: 3.4 ratio (ref 0.0–5.0)
Cholesterol, Total: 236 mg/dL — ABNORMAL HIGH (ref 100–199)
HDL: 70 mg/dL (ref >39)
LDL Chol Calc (NIH): 138 mg/dL — ABNORMAL HIGH (ref 0–99)
Triglycerides: 160 mg/dL — ABNORMAL HIGH (ref 0–149)
VLDL Cholesterol Cal: 28 mg/dL (ref 5–40)

## 2021-05-02 LAB — PSA: Prostate Specific Ag, Serum: 3.3 ng/mL (ref 0.0–4.0)

## 2021-05-02 LAB — CARDIOVASCULAR RISK ASSESSMENT

## 2021-05-06 ENCOUNTER — Other Ambulatory Visit: Payer: Self-pay | Admitting: Physician Assistant

## 2021-05-17 DIAGNOSIS — L820 Inflamed seborrheic keratosis: Secondary | ICD-10-CM | POA: Diagnosis not present

## 2021-05-17 DIAGNOSIS — C44519 Basal cell carcinoma of skin of other part of trunk: Secondary | ICD-10-CM | POA: Diagnosis not present

## 2021-05-31 DIAGNOSIS — M722 Plantar fascial fibromatosis: Secondary | ICD-10-CM | POA: Diagnosis not present

## 2021-05-31 DIAGNOSIS — M2011 Hallux valgus (acquired), right foot: Secondary | ICD-10-CM | POA: Diagnosis not present

## 2021-05-31 DIAGNOSIS — M2012 Hallux valgus (acquired), left foot: Secondary | ICD-10-CM | POA: Diagnosis not present

## 2021-06-03 DIAGNOSIS — R918 Other nonspecific abnormal finding of lung field: Secondary | ICD-10-CM | POA: Diagnosis not present

## 2021-06-03 DIAGNOSIS — R911 Solitary pulmonary nodule: Secondary | ICD-10-CM | POA: Diagnosis not present

## 2021-06-03 DIAGNOSIS — I700 Atherosclerosis of aorta: Secondary | ICD-10-CM | POA: Diagnosis not present

## 2021-06-05 ENCOUNTER — Other Ambulatory Visit: Payer: Self-pay

## 2021-06-05 DIAGNOSIS — R911 Solitary pulmonary nodule: Secondary | ICD-10-CM

## 2021-07-08 DIAGNOSIS — K5730 Diverticulosis of large intestine without perforation or abscess without bleeding: Secondary | ICD-10-CM | POA: Diagnosis not present

## 2021-07-08 DIAGNOSIS — Z86010 Personal history of colonic polyps: Secondary | ICD-10-CM | POA: Diagnosis not present

## 2021-07-08 DIAGNOSIS — Z1211 Encounter for screening for malignant neoplasm of colon: Secondary | ICD-10-CM | POA: Diagnosis not present

## 2021-07-08 LAB — HM COLONOSCOPY

## 2021-07-16 DIAGNOSIS — H8112 Benign paroxysmal vertigo, left ear: Secondary | ICD-10-CM | POA: Diagnosis not present

## 2021-08-14 ENCOUNTER — Other Ambulatory Visit: Payer: Self-pay | Admitting: Physician Assistant

## 2021-09-06 DIAGNOSIS — L570 Actinic keratosis: Secondary | ICD-10-CM | POA: Diagnosis not present

## 2021-09-06 DIAGNOSIS — D225 Melanocytic nevi of trunk: Secondary | ICD-10-CM | POA: Diagnosis not present

## 2021-09-06 DIAGNOSIS — C44519 Basal cell carcinoma of skin of other part of trunk: Secondary | ICD-10-CM | POA: Diagnosis not present

## 2021-09-06 DIAGNOSIS — D2239 Melanocytic nevi of other parts of face: Secondary | ICD-10-CM | POA: Diagnosis not present

## 2021-12-05 DIAGNOSIS — M722 Plantar fascial fibromatosis: Secondary | ICD-10-CM | POA: Diagnosis not present

## 2021-12-08 ENCOUNTER — Other Ambulatory Visit: Payer: Self-pay | Admitting: Physician Assistant

## 2022-03-10 ENCOUNTER — Other Ambulatory Visit: Payer: Self-pay

## 2022-03-10 MED ORDER — LEVOTHYROXINE SODIUM 75 MCG PO TABS: ORAL_TABLET | ORAL | 0 refills | Status: DC

## 2022-05-02 ENCOUNTER — Encounter: Payer: PPO | Admitting: Physician Assistant

## 2022-05-07 ENCOUNTER — Ambulatory Visit (INDEPENDENT_AMBULATORY_CARE_PROVIDER_SITE_OTHER): Payer: PPO | Admitting: Physician Assistant

## 2022-05-07 ENCOUNTER — Encounter: Payer: Self-pay | Admitting: Physician Assistant

## 2022-05-07 VITALS — BP 126/66 | HR 94 | Temp 97.4°F | Ht 66.0 in | Wt 192.4 lb

## 2022-05-07 DIAGNOSIS — E038 Other specified hypothyroidism: Secondary | ICD-10-CM | POA: Diagnosis not present

## 2022-05-07 DIAGNOSIS — Z0000 Encounter for general adult medical examination without abnormal findings: Secondary | ICD-10-CM | POA: Diagnosis not present

## 2022-05-07 DIAGNOSIS — E782 Mixed hyperlipidemia: Secondary | ICD-10-CM | POA: Diagnosis not present

## 2022-05-07 DIAGNOSIS — Z125 Encounter for screening for malignant neoplasm of prostate: Secondary | ICD-10-CM

## 2022-05-07 NOTE — Progress Notes (Signed)
Subjective:   Craig Francis is a 67 y.o. male who presents for Medicare Annual/Subsequent preventive examination.  Review of Systems    CONSTITUTIONAL: Negative for chills, fatigue, fever, unintentional weight gain and unintentional weight loss.  E/N/T: Negative for ear pain, nasal congestion and sore throat.  CARDIOVASCULAR: Negative for chest pain, dizziness, palpitations and pedal edema.  RESPIRATORY: Negative for recent cough and dyspnea.  GASTROINTESTINAL: Negative for abdominal pain, acid reflux symptoms, constipation, diarrhea, nausea and vomiting.  MSK: Negative for arthralgias and myalgias.  INTEGUMENTARY: Negative for rash.  NEUROLOGICAL: Negative for dizziness and headaches.  PSYCHIATRIC: Negative for sleep disturbance and to question depression screen.  Negative for depression, negative for anhedonia.     Pt with history of coronary atherosclerosis noted on chest CT - pt refuses statin treatment and refuses cardiology evaluation Pt with history of lung nodule and due for lung CT - pt refuses and defers until next year Cardiac Risk Factors include: advanced age (>34mn, >>84women);nonePt with history of hypothyroidism - currently on levothyroxine 775m qd     Objective:    PHYSICAL EXAM:   VS: BP 126/66 (BP Location: Right Arm, Patient Position: Sitting, Cuff Size: Normal)   Pulse 94   Temp (!) 97.4 F (36.3 C) (Temporal)   Ht '5\' 6"'$  (1.676 m)   Wt 192 lb 6.4 oz (87.3 kg)   SpO2 97%   BMI 31.05 kg/m   GEN: Well nourished, well developed, in no acute distress  Cardiac: RRR; no murmurs, rubs, or gallops,no edema - Respiratory:  normal respiratory rate and pattern with no distress - normal breath sounds with no rales, rhonchi, wheezes or rubs Skin: warm and dry, no rash  Psych: euthymic mood, appropriate affect and demeanor      05/07/2022    8:59 AM 05/01/2021    8:14 AM 07/30/2020    3:31 PM 04/26/2020    8:40 AM  Advanced Directives  Does Patient Have a  Medical Advance Directive? Yes Yes No No  Type of [place] Living will    Does patient want to make changes to medical advance directive? No - Patient declined No - Patient declined  Yes (ED - Information included in AVS)  Would patient like information on creating a medical advance directive?   No - Patient declined Yes (ED - Information included in AVS)    Current Medications (verified) Outpatient Encounter Medications as of 05/07/2022  Medication Sig   levothyroxine (SYNTHROID) 75 MCG tablet TAKE 1 TABLET(75 MCG) BY MOUTH DAILY BEFORE BREAKFAST   Multiple Vitamin (MULTIVITAMIN ADULT PO) Take by mouth.   No facility-administered encounter medications on file as of 05/07/2022.    Allergies (verified) Patient has no known allergies.   History: Past Medical History:  Diagnosis Date   Hypothyroid    History reviewed. No pertinent surgical history. History reviewed. No pertinent family history. Social History   Socioeconomic History   Marital status: Married    Spouse name: Not on file   Number of children: Not on file   Years of education: Not on file   Highest education level: Not on file  Occupational History   Not on file  Tobacco Use   Smoking status: Never   Smokeless tobacco: Never  Vaping Use   Vaping Use: Never used  Substance and Sexual Activity   Alcohol use: Yes    Alcohol/week: 1.0 standard drink of alcohol    Types: 1 Cans of beer  per week   Drug use: Never   Sexual activity: Not on file  Other Topics Concern   Not on file  Social History Narrative   Not on file   Social Determinants of Health   Financial Resource Strain: Low Risk  (05/07/2022)   Overall Financial Resource Strain (CARDIA)    Difficulty of Paying Living Expenses: Not hard at all  Food Insecurity: No Food Insecurity (05/07/2022)   Hunger Vital Sign    Worried About Running Out of Food in the Last Year: Never true    Ran Out of Food in the  Last Year: Never true  Transportation Needs: No Transportation Needs (05/07/2022)   PRAPARE - Hydrologist (Medical): No    Lack of Transportation (Non-Medical): No  Physical Activity: Sufficiently Active (05/07/2022)   Exercise Vital Sign    Days of Exercise per Week: 7 days    Minutes of Exercise per Session: 30 min  Stress: No Stress Concern Present (05/07/2022)   [place]    Feeling of Stress : Only a little  Social Connections: Moderately Integrated (05/07/2022)   Social Connection and Isolation Panel [NHANES]    Frequency of Communication with Friends and Family: More than three times a week    Frequency of Social Gatherings with Friends and Family: Never    Attends Religious Services: 1 to 4 times per year    Active Member of [employer] or Organizations: No    Attends Archivist Meetings: Never    Marital Status: Married    Tobacco Counseling Counseling given: Not Answered   Clinical Intake:                 Diabetic?NO         Activities of Daily Living    05/07/2022    8:59 AM  In your present state of health, do you have any difficulty performing the following activities:  Hearing? 0  Vision? 0  Difficulty concentrating or making decisions? 0  Walking or climbing stairs? 0  Dressing or bathing? 0  Doing errands, shopping? 0  Preparing Food and eating ? N  Using the Toilet? N  In the past six months, have you accidently leaked urine? N  Do you have problems with loss of bowel control? N  Managing your Medications? N  Managing your Finances? N  Housekeeping or managing your Housekeeping? N    Patient Care Team: Marge Duncans, Hershal Coria as PCP - General (Physician Assistant)  Indicate any recent Medical Services you may have received from other than [facility] providers in the past year (date may be approximate).     Assessment:   This is a routine wellness  examination for Craig Francis.  Hearing/Vision screen No results found.  Dietary issues and exercise activities discussed: Current Exercise Habits: Home exercise routine, Type of exercise: walking, Time (Minutes): 30, Frequency (Times/Week): 7, Weekly Exercise (Minutes/Week): 210, Intensity: Mild, Exercise limited by: None identified   Goals Addressed   None   Depression Screen    05/07/2022    8:57 AM 05/01/2021    8:12 AM 04/26/2020    8:19 AM  PHQ 2/9 Scores  PHQ - 2 Score 0 0 0    Fall Risk    05/07/2022    8:59 AM 05/01/2021    8:15 AM 04/26/2020    8:37 AM 03/30/2019    9:23 AM  Fall Risk   Falls in the past  year? 0 0 0 0  Comment    Emmi Telephone Survey: data to providers prior to load  Number falls in past yr: 0 0 0   Injury with Fall? 0 0 0   Risk for fall due to : No Fall Risks No Fall Risks No Fall Risks   Follow up Falls evaluation completed Falls evaluation completed;Falls prevention discussed      FALL RISK PREVENTION PERTAINING TO THE HOME:  Any stairs in or around the home? Yes  If so, are there any without handrails? Yes  Home free of loose throw rugs in walkways, pet beds, electrical cords, etc? No  Adequate lighting in your home to reduce risk of falls? Yes   ASSISTIVE DEVICES UTILIZED TO PREVENT FALLS:  Life alert? No  Use of a cane, walker or w/c? No  Grab bars in the bathroom? No  Shower chair or bench in shower? No  Elevated toilet seat or a handicapped toilet? No   TIMED UP AND GO:  Was the test performed? Yes .  Length of time to ambulate 10 feet: 15 sec.   Gait steady and fast without use of assistive device  Cognitive Function:        05/07/2022    9:00 AM 05/01/2021    8:15 AM 04/26/2020    8:39 AM  6CIT Screen  What Year? 0 points 0 points 0 points  What month? 0 points 0 points 0 points  What time? 0 points 0 points 0 points  Count back from 20 0 points 0 points 0 points  Months in reverse 0 points 0 points 0 points  Repeat  phrase 0 points 0 points 0 points  Total Score 0 points 0 points 0 points    Immunizations Immunization History  Administered Date(s) Administered   Fluad Quad(high Dose 65+) 05/01/2021   Influenza-Unspecified 03/01/2020   Moderna Sars-Covid-2 Vaccination 06/17/2019, 07/15/2019, 03/01/2020   Pneumococcal Conjugate-13 04/26/2020   Pneumococcal Polysaccharide-23 05/01/2021   Td 03/18/2018   Tdap 07/30/2020    TDAP status: Up to date  Flu Vaccine status: Declined, Education has been provided regarding the importance of this vaccine but patient still declined. Advised may receive this vaccine at local pharmacy or Health Dept. Aware to provide a copy of the vaccination record if obtained from local pharmacy or Health Dept. Verbalized acceptance and understanding.  Pneumococcal vaccine status: Up to date  Covid-19 vaccine status: Completed vaccines  Qualifies for Shingles Vaccine? Yes   Zostavax completed No   Shingrix Completed?: No.    Education has been provided regarding the importance of this vaccine. Patient has been advised to call insurance company to determine out of pocket expense if they have not yet received this vaccine. Advised may also receive vaccine at local pharmacy or Health Dept. Verbalized acceptance and understanding.  Screening Tests Health Maintenance  Topic Date Due   Zoster Vaccines- Shingrix (1 of 2) Never done   INFLUENZA VACCINE  08/03/2022 (Originally 12/03/2021)   COLONOSCOPY (Pts 45-42yr Insurance coverage will need to be confirmed)  10/31/2022 (Originally 12/25/2020)   Medicare Annual Wellness (AWV)  05/08/2023   DTaP/Tdap/Td (3 - Td or Tdap) 07/31/2030   Pneumonia Vaccine 67 Years old  Completed   HPV VACCINES  Aged Out   COVID-19 Vaccine  Discontinued   Hepatitis C Screening  Discontinued    Health Maintenance  Health Maintenance Due  Topic Date Due   Zoster Vaccines- Shingrix (1 of 2) Never done    Colorectal  cancer screening: Type of  screening: Colonoscopy. Completed 12/26/2015. Repeat every 5 years  Lung Cancer Screening: (Low Dose CT Chest recommended if Age 65-80 years, 30 pack-year currently smoking OR have quit w/in 15years.) does not qualify.   Lung Cancer Screening Referral: N/A  Additional Screening:  Hepatitis C Screening: does qualify; Completed 2017  Vision Screening: Recommended annual ophthalmology exams for early detection of glaucoma and other disorders of the eye. Is the patient up to date with their annual eye exam?  Yes  Who is the provider or what is the name of the office in which the patient attends annual eye exams? [facility] If pt is not established with a provider, would they like to be referred to a provider to establish care? No .   Dental Screening: Recommended annual dental exams for proper oral hygiene  Community Resource Referral / Chronic Care Management: CRR required this visit?  No   CCM required this visit?  No      Plan:     I have personally reviewed and noted the following in the patient's chart:   Medical and social history Use of alcohol, tobacco or illicit drugs  Current medications and supplements including opioid prescriptions. Patient is not currently taking opioid prescriptions. Functional ability and status Nutritional status Physical activity Advanced directives List of other physicians Hospitalizations, surgeries, and ER visits in previous 12 months Vitals Screenings to include cognitive, depression, and falls Referrals and appointments  In addition, I have reviewed and discussed with patient certain preventive protocols, quality metrics, and best practice recommendations. A written personalized care plan for preventive services as well as general preventive health recommendations were provided to patient.   Continue synthroid 82mg qd Recommend statin/cardiology evaluation - pt refuses Recommend follow up CT lung - pt refuses

## 2022-05-08 LAB — CBC WITH DIFFERENTIAL/PLATELET
Basophils Absolute: 0.1 10*3/uL (ref 0.0–0.2)
Basos: 1 %
EOS (ABSOLUTE): 0.2 10*3/uL (ref 0.0–0.4)
Eos: 3 %
Hematocrit: 45 % (ref 37.5–51.0)
Hemoglobin: 16.3 g/dL (ref 13.0–17.7)
Immature Grans (Abs): 0 10*3/uL (ref 0.0–0.1)
Immature Granulocytes: 0 %
Lymphocytes Absolute: 2.2 10*3/uL (ref 0.7–3.1)
Lymphs: 33 %
MCH: 32.4 pg (ref 26.6–33.0)
MCHC: 36.2 g/dL — ABNORMAL HIGH (ref 31.5–35.7)
MCV: 90 fL (ref 79–97)
Monocytes Absolute: 0.5 10*3/uL (ref 0.1–0.9)
Monocytes: 8 %
Neutrophils Absolute: 3.8 10*3/uL (ref 1.4–7.0)
Neutrophils: 55 %
Platelets: 245 10*3/uL (ref 150–450)
RBC: 5.03 x10E6/uL (ref 4.14–5.80)
RDW: 11.7 % (ref 11.6–15.4)
WBC: 6.8 10*3/uL (ref 3.4–10.8)

## 2022-05-08 LAB — PSA: Prostate Specific Ag, Serum: 4.8 ng/mL — ABNORMAL HIGH (ref 0.0–4.0)

## 2022-05-08 LAB — COMPREHENSIVE METABOLIC PANEL
ALT: 18 IU/L (ref 0–44)
AST: 20 IU/L (ref 0–40)
Albumin/Globulin Ratio: 1.8 (ref 1.2–2.2)
Albumin: 4.8 g/dL (ref 3.9–4.9)
Alkaline Phosphatase: 43 IU/L — ABNORMAL LOW (ref 44–121)
BUN/Creatinine Ratio: 11 (ref 10–24)
BUN: 10 mg/dL (ref 8–27)
Bilirubin Total: 0.7 mg/dL (ref 0.0–1.2)
CO2: 21 mmol/L (ref 20–29)
Calcium: 9.6 mg/dL (ref 8.6–10.2)
Chloride: 101 mmol/L (ref 96–106)
Creatinine, Ser: 0.93 mg/dL (ref 0.76–1.27)
Globulin, Total: 2.7 g/dL (ref 1.5–4.5)
Glucose: 95 mg/dL (ref 70–99)
Potassium: 4.1 mmol/L (ref 3.5–5.2)
Sodium: 139 mmol/L (ref 134–144)
Total Protein: 7.5 g/dL (ref 6.0–8.5)
eGFR: 89 mL/min/{1.73_m2} (ref >59)

## 2022-05-08 LAB — LIPID PANEL
Chol/HDL Ratio: 3.6 ratio (ref 0.0–5.0)
Cholesterol, Total: 248 mg/dL — ABNORMAL HIGH (ref 100–199)
HDL: 69 mg/dL (ref >39)
LDL Chol Calc (NIH): 152 mg/dL — ABNORMAL HIGH (ref 0–99)
Triglycerides: 151 mg/dL — ABNORMAL HIGH (ref 0–149)
VLDL Cholesterol Cal: 27 mg/dL (ref 5–40)

## 2022-05-08 LAB — CARDIOVASCULAR RISK ASSESSMENT

## 2022-05-08 LAB — TSH: TSH: 3.05 u[IU]/mL (ref 0.450–4.500)

## 2022-05-13 ENCOUNTER — Other Ambulatory Visit: Payer: Self-pay | Admitting: Physician Assistant

## 2022-05-13 DIAGNOSIS — R972 Elevated prostate specific antigen [PSA]: Secondary | ICD-10-CM

## 2022-05-13 MED ORDER — LEVOTHYROXINE SODIUM 75 MCG PO TABS: ORAL_TABLET | ORAL | 1 refills | Status: DC

## 2022-06-04 DIAGNOSIS — H25813 Combined forms of age-related cataract, bilateral: Secondary | ICD-10-CM | POA: Diagnosis not present

## 2022-06-04 DIAGNOSIS — H52223 Regular astigmatism, bilateral: Secondary | ICD-10-CM | POA: Diagnosis not present

## 2022-06-04 DIAGNOSIS — H524 Presbyopia: Secondary | ICD-10-CM | POA: Diagnosis not present

## 2022-06-04 DIAGNOSIS — H5203 Hypermetropia, bilateral: Secondary | ICD-10-CM | POA: Diagnosis not present

## 2022-07-30 DIAGNOSIS — R972 Elevated prostate specific antigen [PSA]: Secondary | ICD-10-CM | POA: Diagnosis not present

## 2022-09-12 DIAGNOSIS — L300 Nummular dermatitis: Secondary | ICD-10-CM | POA: Diagnosis not present

## 2022-09-12 DIAGNOSIS — L570 Actinic keratosis: Secondary | ICD-10-CM | POA: Diagnosis not present

## 2022-09-12 DIAGNOSIS — D225 Melanocytic nevi of trunk: Secondary | ICD-10-CM | POA: Diagnosis not present

## 2022-09-12 DIAGNOSIS — D2239 Melanocytic nevi of other parts of face: Secondary | ICD-10-CM | POA: Diagnosis not present

## 2022-12-17 ENCOUNTER — Other Ambulatory Visit: Payer: Self-pay | Admitting: Physician Assistant

## 2023-05-11 ENCOUNTER — Encounter: Payer: PPO | Admitting: Physician Assistant

## 2023-05-12 ENCOUNTER — Ambulatory Visit: Payer: PPO

## 2023-05-12 VITALS — BP 130/82 | HR 92 | Resp 16 | Ht 66.0 in | Wt 193.6 lb

## 2023-05-12 DIAGNOSIS — Z0000 Encounter for general adult medical examination without abnormal findings: Secondary | ICD-10-CM

## 2023-05-12 NOTE — Progress Notes (Signed)
 Subjective:   Craig Francis is a 68 y.o. male who presents for Medicare Annual/Subsequent preventive examination.  This wellness visit is conducted by a nurse.  The patient's medications were reviewed and reconciled since the patient's last visit.  History details were provided by the patient.  The history appears to be reliable.    Medical History: Patient history and Family history was reviewed  Medications, Allergies, and preventative health maintenance was reviewed and updated.   Visit Complete: In person       Objective:    Today's Vitals   05/12/23 1342  BP: 130/82  Pulse: 92  Resp: 16  Weight: 193 lb 9.6 oz (87.8 kg)  Height: 5' 6 (1.676 m)  PainSc: 0-No pain   Body mass index is 31.25 kg/m.     05/07/2022    8:59 AM 05/01/2021    8:14 AM 07/30/2020    3:31 PM 04/26/2020    8:40 AM  Advanced Directives  Does Patient Have a Medical Advance Directive? Yes Yes No No  Type of Estate Agent of [place];Living will Living will    Does patient want to make changes to medical advance directive? No - Patient declined No - Patient declined  Yes (ED - Information included in AVS)  Would patient like information on creating a medical advance directive?   No - Patient declined Yes (ED - Information included in AVS)    Current Medications (verified) Outpatient Encounter Medications as of 05/12/2023  Medication Sig   levothyroxine  (SYNTHROID ) 75 MCG tablet TAKE ONE TABLET BY MOUTH EVERY DAY BEFORE BREAKFAST   Multiple Vitamin (MULTIVITAMIN ADULT PO) Take by mouth.   No facility-administered encounter medications on file as of 05/12/2023.    Allergies (verified) Patient has no known allergies.   History: Past Medical History:  Diagnosis Date   Hypothyroid    Past Surgical History:  Procedure Laterality Date   APPENDECTOMY     BACK SURGERY     MOUTH SURGERY     SHOULDER SURGERY     Family History  Problem Relation Age of Onset   Heart  disease Other    Social History   Socioeconomic History   Marital status: Married    Spouse name: Deniese   Number of children: 0   Years of education: Not on file   Highest education level: GED or equivalent  Occupational History   Not on file  Tobacco Use   Smoking status: Never   Smokeless tobacco: Never  Vaping Use   Vaping status: Never Used  Substance and Sexual Activity   Alcohol use: Yes    Alcohol/week: 1.0 standard drink of alcohol    Types: 1 Cans of beer per week    Comment: occasional   Drug use: Never   Sexual activity: Not on file  Other Topics Concern   Not on file  Social History Narrative   One step-daughter   Has GED and a few years of business school   Social Drivers of Health   Financial Resource Strain: Low Risk  (05/12/2023)   Overall Financial Resource Strain (CARDIA)    Difficulty of Paying Living Expenses: Not hard at all  Food Insecurity: No Food Insecurity (05/12/2023)   Hunger Vital Sign    Worried About Running Out of Food in the Last Year: Never true    Ran Out of Food in the Last Year: Never true  Transportation Needs: No Transportation Needs (05/12/2023)   PRAPARE - Transportation  Lack of Transportation (Medical): No    Lack of Transportation (Non-Medical): No  Physical Activity: Sufficiently Active (05/12/2023)   Exercise Vital Sign    Days of Exercise per Week: 7 days    Minutes of Exercise per Session: 30 min  Stress: No Stress Concern Present (05/12/2023)   [employer] of Occupational Health - Occupational Stress Questionnaire    Feeling of Stress : Only a little  Social Connections: Moderately Integrated (05/12/2023)   Social Connection and Isolation Panel [NHANES]    Frequency of Communication with Friends and Family: More than three times a week    Frequency of Social Gatherings with Friends and Family: Never    Attends Religious Services: 1 to 4 times per year    Active Member of [employer] or Organizations: No    Attends Probation Officer: Never    Marital Status: Married    Tobacco Counseling Counseling given: Not Answered   Clinical Intake:  Pre-visit preparation completed: Yes  Pain : No/denies pain Pain Score: 0-No pain     BMI - recorded: 31.25 Nutritional Status: BMI > 30  Obese Nutritional Risks: None Diabetes: No  How often do you need to have someone help you when you read instructions, pamphlets, or other written materials from your doctor or pharmacy?: 1 - Never  Interpreter Needed?: No      Activities of Daily Living    05/12/2023    1:50 PM  In your present state of health, do you have any difficulty performing the following activities:  Hearing? 0  Vision? 0  Difficulty concentrating or making decisions? 0  Walking or climbing stairs? 0  Dressing or bathing? 0  Doing errands, shopping? 0  Preparing Food and eating ? N  Using the Toilet? N  In the past six months, have you accidently leaked urine? N  Do you have problems with loss of bowel control? N  Managing your Medications? N  Managing your Finances? N  Housekeeping or managing your Housekeeping? N    Patient Care Team: Nicholaus Credit, PA-C as PCP - General (Physician Assistant)  Indicate any recent Medical Services you may have received from other than [facility] providers in the past year (date may be approximate).     Assessment:   This is a routine wellness examination for Craig Francis.  Hearing/Vision screen No results found.   Goals Addressed   None    Depression Screen    05/12/2023    1:50 PM 05/07/2022    8:57 AM 05/01/2021    8:12 AM 04/26/2020    8:19 AM  PHQ 2/9 Scores  PHQ - 2 Score 0 0 0 0    Fall Risk    05/12/2023    1:49 PM 05/07/2022    8:59 AM 05/01/2021    8:15 AM 04/26/2020    8:37 AM 03/30/2019    9:23 AM  Fall Risk   Falls in the past year? 0 0 0 0 0  Comment     Emmi Telephone Survey: data to providers prior to load  Number falls in past yr: 0 0 0 0   Injury with Fall? 0 0 0  0   Risk for fall due to :  No Fall Risks No Fall Risks No Fall Risks   Follow up  Falls evaluation completed Falls evaluation completed;Falls prevention discussed      MEDICARE RISK AT HOME: Medicare Risk at Home Any stairs in or around the home?: Yes If so, are  there any without handrails?: No Home free of loose throw rugs in walkways, pet beds, electrical cords, etc?: Yes Adequate lighting in your home to reduce risk of falls?: Yes Life alert?: No Use of a cane, walker or w/c?: No Grab bars in the bathroom?: Yes Shower chair or bench in shower?: Yes Elevated toilet seat or a handicapped toilet?: No  TIMED UP AND GO:  Was the test performed?  Yes  Length of time to ambulate 10 feet: 4 sec Gait steady and fast without use of assistive device    Cognitive Function:        05/07/2022    9:00 AM 05/01/2021    8:15 AM 04/26/2020    8:39 AM  6CIT Screen  What Year? 0 points 0 points 0 points  What month? 0 points 0 points 0 points  What time? 0 points 0 points 0 points  Count back from 20 0 points 0 points 0 points  Months in reverse 0 points 0 points 0 points  Repeat phrase 0 points 0 points 0 points  Total Score 0 points 0 points 0 points    Immunizations Immunization History  Administered Date(s) Administered   Fluad Quad(high Dose 65+) 05/01/2021   Influenza-Unspecified 03/01/2020   Moderna Sars-Covid-2 Vaccination 06/17/2019, 07/15/2019, 03/01/2020   Pneumococcal Conjugate-13 04/26/2020   Pneumococcal Polysaccharide-23 05/01/2021   Td 03/18/2018   Tdap 07/30/2020    TDAP status: Up to date  Flu Vaccine status: Declined, Education has been provided regarding the importance of this vaccine but patient still declined. Advised may receive this vaccine at local pharmacy or Health Dept. Aware to provide a copy of the vaccination record if obtained from local pharmacy or Health Dept. Verbalized acceptance and understanding.  Pneumococcal vaccine status: Up to  date  Covid-19 vaccine status: Declined, Education has been provided regarding the importance of this vaccine but patient still declined. Advised may receive this vaccine at local pharmacy or Health Dept.or vaccine clinic. Aware to provide a copy of the vaccination record if obtained from local pharmacy or Health Dept. Verbalized acceptance and understanding.  Qualifies for Shingles Vaccine? Yes   Zostavax completed No   Shingrix Completed?: No.    Education has been provided regarding the importance of this vaccine. Patient has been advised to call insurance company to determine out of pocket expense if they have not yet received this vaccine. Advised may also receive vaccine at local pharmacy or Health Dept. Verbalized acceptance and understanding.  Screening Tests Health Maintenance  Topic Date Due   Zoster Vaccines- Shingrix (1 of 2) Never done   Colonoscopy  12/25/2020   Medicare Annual Wellness (AWV)  05/08/2023   INFLUENZA VACCINE  08/03/2023 (Originally 12/04/2022)   DTaP/Tdap/Td (3 - Td or Tdap) 07/31/2030   Pneumonia Vaccine 63+ Years old  Completed   HPV VACCINES  Aged Out   COVID-19 Vaccine  Discontinued   Hepatitis C Screening  Discontinued    Health Maintenance  Health Maintenance Due  Topic Date Due   Zoster Vaccines- Shingrix (1 of 2) Never done   Colonoscopy  12/25/2020   Medicare Annual Wellness (AWV)  05/08/2023    Colorectal cancer screening: Type of screening: Colonoscopy. Completed a couple of years ago by Dr Towana, records requested  Lung Cancer Screening: (Low Dose CT Chest recommended if Age 84-80 years, 20 pack-year currently smoking OR have quit w/in 15years.) does not qualify.   Additional Screening:  Vision Screening: Recommended annual ophthalmology exams for early detection of  glaucoma and other disorders of the eye. Is the patient up to date with their annual eye exam?  Yes   Dental Screening: Recommended annual dental exams for proper oral  hygiene  Community Resource Referral / Chronic Care Management: CRR required this visit?  No   CCM required this visit?  No     Plan:    1- Appointment scheduled with provider for office visit and labs 2- Colonoscopy records requested from Dr Thressa office  I have personally reviewed and noted the following in the patient's chart:   Medical and social history Use of alcohol, tobacco or illicit drugs  Current medications and supplements including opioid prescriptions. Patient is not currently taking opioid prescriptions. Functional ability and status Nutritional status Physical activity Advanced directives List of other physicians Hospitalizations, surgeries, and ER visits in previous 12 months Vitals Screenings to include cognitive, depression, and falls Referrals and appointments  In addition, I have reviewed and discussed with patient certain preventive protocols, quality metrics, and best practice recommendations. A written personalized care plan for preventive services as well as general preventive health recommendations were provided to patient.     Suzen CHRISTELLA Sharps, LPN   12/04/7972

## 2023-05-12 NOTE — Patient Instructions (Signed)
 [name] , Thank you for taking time to come for your Medicare Wellness Visit. I appreciate your ongoing commitment to your health goals. Please review the following plan we discussed and let me know if I can assist you in the future.    This is a list of the screening recommended for you and due dates:  Health Maintenance  Topic Date Due   Zoster (Shingles) Vaccine (1 of 2) Never done   Colon Cancer Screening  12/25/2020   Flu Shot  08/03/2023*   Medicare Annual Wellness Visit  05/11/2024   DTaP/Tdap/Td vaccine (3 - Td or Tdap) 07/31/2030   Pneumonia Vaccine  Completed   HPV Vaccine  Aged Out   COVID-19 Vaccine  Discontinued   Hepatitis C Screening  Discontinued  *Topic was postponed. The date shown is not the original due date.     Preventive Care 66 Years and Older, Male  Preventive care refers to lifestyle choices and visits with your health care provider that can promote health and wellness. What does preventive care include? A yearly physical exam. This is also called an annual well check. Dental exams once or twice a year. Routine eye exams. Ask your health care provider how often you should have your eyes checked. Personal lifestyle choices, including: Daily care of your teeth and gums. Regular physical activity. Eating a healthy diet. Avoiding tobacco and drug use. Limiting alcohol use. Practicing safe sex. Taking low doses of aspirin every day. Taking vitamin and mineral supplements as recommended by your health care provider. What happens during an annual well check? The services and screenings done by your health care provider during your annual well check will depend on your age, overall health, lifestyle risk factors, and family history of disease. Counseling  Your health care provider may ask you questions about your: Alcohol use. Tobacco use. Drug use. Emotional well-being. Home and relationship well-being. Sexual activity. Eating habits. History of  falls. Memory and ability to understand (cognition). Work and work astronomer. Screening  You may have the following tests or measurements: Height, weight, and BMI. Blood pressure. Lipid and cholesterol levels. These may be checked every 5 years, or more frequently if you are over 57 years old. Skin check. Lung cancer screening. You may have this screening every year starting at age 21 if you have a 30-pack-year history of smoking and currently smoke or have quit within the past 15 years. Fecal occult blood test (FOBT) of the stool. You may have this test every year starting at age 50. Flexible sigmoidoscopy or colonoscopy. You may have a sigmoidoscopy every 5 years or a colonoscopy every 10 years starting at age 61. Prostate cancer screening. Recommendations will vary depending on your family history and other risks. Hepatitis C blood test. Hepatitis B blood test. Sexually transmitted disease (STD) testing. Diabetes screening. This is done by checking your blood sugar (glucose) after you have not eaten for a while (fasting). You may have this done every 1-3 years. Abdominal aortic aneurysm (AAA) screening. You may need this if you are a current or former smoker. Osteoporosis. You may be screened starting at age 47 if you are at high risk. Talk with your health care provider about your test results, treatment options, and if necessary, the need for more tests. Vaccines  Your health care provider may recommend certain vaccines, such as: Influenza vaccine. This is recommended every year. Tetanus, diphtheria, and acellular pertussis (Tdap, Td) vaccine. You may need a Td booster every 10 years.  Zoster vaccine. You may need this after age 15. Pneumococcal 13-valent conjugate (PCV13) vaccine. One dose is recommended after age 70. Pneumococcal polysaccharide (PPSV23) vaccine. One dose is recommended after age 69. Talk to your health care provider about which screenings and vaccines you need and  how often you need them. This information is not intended to replace advice given to you by your health care provider. Make sure you discuss any questions you have with your health care provider. Document Released: 05/18/2015 Document Revised: 01/09/2016 Document Reviewed: 02/20/2015 Elsevier Interactive Patient Education  2017 [employer].  Fall Prevention in the Home Falls can cause injuries. They can happen to people of all ages. There are many things you can do to make your home safe and to help prevent falls. What can I do on the outside of my home? Regularly fix the edges of walkways and driveways and fix any cracks. Remove anything that might make you trip as you walk through a door, such as a raised step or threshold. Trim any bushes or trees on the path to your home. Use bright outdoor lighting. Clear any walking paths of anything that might make someone trip, such as rocks or tools. Regularly check to see if handrails are loose or broken. Make sure that both sides of any steps have handrails. Any raised decks and porches should have guardrails on the edges. Have any leaves, snow, or ice cleared regularly. Use sand or salt on walking paths during winter. Clean up any spills in your garage right away. This includes oil or grease spills. What can I do in the bathroom? Use night lights. Install grab bars by the toilet and in the tub and shower. Do not use towel bars as grab bars. Use non-skid mats or decals in the tub or shower. If you need to sit down in the shower, use a plastic, non-slip stool. Keep the floor dry. Clean up any water that spills on the floor as soon as it happens. Remove soap buildup in the tub or shower regularly. Attach bath mats securely with double-sided non-slip rug tape. Do not have throw rugs and other things on the floor that can make you trip. What can I do in the bedroom? Use night lights. Make sure that you have a light by your bed that is easy to  reach. Do not use any sheets or blankets that are too big for your bed. They should not hang down onto the floor. Have a firm chair that has side arms. You can use this for support while you get dressed. Do not have throw rugs and other things on the floor that can make you trip. What can I do in the kitchen? Clean up any spills right away. Avoid walking on wet floors. Keep items that you use a lot in easy-to-reach places. If you need to reach something above you, use a strong step stool that has a grab bar. Keep electrical cords out of the way. Do not use floor polish or wax that makes floors slippery. If you must use wax, use non-skid floor wax. Do not have throw rugs and other things on the floor that can make you trip. What can I do with my stairs? Do not leave any items on the stairs. Make sure that there are handrails on both sides of the stairs and use them. Fix handrails that are broken or loose. Make sure that handrails are as long as the stairways. Check any carpeting to make sure that  it is firmly attached to the stairs. Fix any carpet that is loose or worn. Avoid having throw rugs at the top or bottom of the stairs. If you do have throw rugs, attach them to the floor with carpet tape. Make sure that you have a light switch at the top of the stairs and the bottom of the stairs. If you do not have them, ask someone to add them for you. What else can I do to help prevent falls? Wear shoes that: Do not have high heels. Have rubber bottoms. Are comfortable and fit you well. Are closed at the toe. Do not wear sandals. If you use a stepladder: Make sure that it is fully opened. Do not climb a closed stepladder. Make sure that both sides of the stepladder are locked into place. Ask someone to hold it for you, if possible. Clearly mark and make sure that you can see: Any grab bars or handrails. First and last steps. Where the edge of each step is. Use tools that help you move  around (mobility aids) if they are needed. These include: Canes. Walkers. Scooters. Crutches. Turn on the lights when you go into a dark area. Replace any light bulbs as soon as they burn out. Set up your furniture so you have a clear path. Avoid moving your furniture around. If any of your floors are uneven, fix them. If there are any pets around you, be aware of where they are. Review your medicines with your doctor. Some medicines can make you feel dizzy. This can increase your chance of falling. Ask your doctor what other things that you can do to help prevent falls. This information is not intended to replace advice given to you by your health care provider. Make sure you discuss any questions you have with your health care provider. Document Released: 02/15/2009 Document Revised: 09/27/2015 Document Reviewed: 05/26/2014 Elsevier Interactive Patient Education  2017 [employer].

## 2023-05-21 ENCOUNTER — Encounter: Payer: Self-pay | Admitting: Physician Assistant

## 2023-05-21 ENCOUNTER — Ambulatory Visit (INDEPENDENT_AMBULATORY_CARE_PROVIDER_SITE_OTHER): Payer: PPO | Admitting: Physician Assistant

## 2023-05-21 VITALS — BP 122/76 | HR 67 | Temp 97.3°F | Resp 18 | Ht 66.0 in | Wt 193.0 lb

## 2023-05-21 DIAGNOSIS — R911 Solitary pulmonary nodule: Secondary | ICD-10-CM | POA: Diagnosis not present

## 2023-05-21 DIAGNOSIS — R972 Elevated prostate specific antigen [PSA]: Secondary | ICD-10-CM | POA: Diagnosis not present

## 2023-05-21 DIAGNOSIS — E782 Mixed hyperlipidemia: Secondary | ICD-10-CM | POA: Diagnosis not present

## 2023-05-21 DIAGNOSIS — E038 Other specified hypothyroidism: Secondary | ICD-10-CM

## 2023-05-21 DIAGNOSIS — R918 Other nonspecific abnormal finding of lung field: Secondary | ICD-10-CM

## 2023-05-21 DIAGNOSIS — Z5320 Procedure and treatment not carried out because of patient's decision for unspecified reasons: Secondary | ICD-10-CM | POA: Insufficient documentation

## 2023-05-21 NOTE — Progress Notes (Signed)
Subjective:  Patient ID: Craig Francis, male    DOB: 05-Sep-1955  Age: 68 y.o. MRN: [id number]  Chief Complaint  Patient presents with   Medical Management of Chronic Issues    HPI Pt with hypothyroidism - currently on synthroid qd. Voices no concerns or problems - due for labwork  Pt does have hyperlipidemia and aortic atherosclerosis - he declines statin treatment - states he 'feels fine' and has no issues and does not want to start medication  Pt was referred to urology last year after PSA was noted to be elevated.  He did follow up with them and repeat level was normal.  He did not follow up after that - will repeat PSA today  Pt had chest CT in 1/23 that showed new pulmonary nodules.  He was advised to repeat in 1/24 but pt deferred until this year.  He is agreeable to schedule follow up chest CT     05/21/2023   10:15 AM 05/12/2023    1:50 PM 05/07/2022    8:57 AM 05/01/2021    8:12 AM 04/26/2020    8:19 AM  Depression screen PHQ 2/9  Decreased Interest 0 0 0 0 0  Down, Depressed, Hopeless 0 0 0 0 0  PHQ - 2 Score 0 0 0 0 0        03/30/2019    9:23 AM 04/26/2020    8:37 AM 05/01/2021    8:15 AM 05/07/2022    8:59 AM 05/12/2023    1:49 PM  Fall Risk  Falls in the past year? 0 0 0 0 0  Was there an injury with Fall?  0 0 0 0  Fall Risk Category Calculator  0 0 0 0  Fall Risk Category (Retired)  Low Low Low   (RETIRED) Patient Fall Risk Level  Low fall risk  Low fall risk   Patient at Risk for Falls Due to  No Fall Risks No Fall Risks No Fall Risks   Fall risk Follow up   Falls evaluation completed;Falls prevention discussed Falls evaluation completed      ROS CONSTITUTIONAL: Negative for chills, fatigue, fever, unintentional weight gain and unintentional weight loss.  E/N/T: Negative for ear pain, nasal congestion and sore throat.  CARDIOVASCULAR: Negative for chest pain, dizziness, palpitations and pedal edema.  RESPIRATORY: Negative for recent cough and  dyspnea.  GASTROINTESTINAL: Negative for abdominal pain, acid reflux symptoms, constipation, diarrhea, nausea and vomiting.  MSK: Negative for arthralgias and myalgias.  INTEGUMENTARY: Negative for rash.  NEUROLOGICAL: Negative for dizziness and headaches.  PSYCHIATRIC: Negative for sleep disturbance and to question depression screen.  Negative for depression, negative for anhedonia.    Current Outpatient Medications:    levothyroxine (SYNTHROID) 75 MCG tablet, TAKE ONE TABLET BY MOUTH EVERY DAY BEFORE BREAKFAST, Disp: 90 tablet, Rfl: 1   Multiple Vitamins-Minerals (CENTRUM ADULT PO), Take by mouth., Disp: , Rfl:   Past Medical History:  Diagnosis Date   Hypothyroid    Objective:  PHYSICAL EXAM:   BP 122/76 (BP Location: Right Arm, Patient Position: Sitting, Cuff Size: Large)   Pulse 67   Temp (!) 97.3 F (36.3 C) (Temporal)   Resp 18   Ht 5\' 6"  (1.676 m)   Wt 193 lb (87.5 kg)   SpO2 98%   BMI 31.15 kg/m    GEN: Well nourished, well developed, in no acute distress  Cardiac: RRR; no murmurs, rubs, or gallops,no edema -  Respiratory:  normal respiratory  rate and pattern with no distress - normal breath sounds with no rales, rhonchi, wheezes or rubs MS: no deformity or atrophy  Skin: warm and dry, no rash  Neuro:  Alert and Oriented x 3,- CN II-Xii grossly intact Psych: euthymic mood, appropriate affect and demeanor  Assessment & Plan:    Adult onset hypothyroidism -     TSH Continue levothyroxine qd Mixed hyperlipidemia -     CBC with Differential/Platelet -     Comprehensive metabolic panel -     Lipid panel  Elevated PSA -     PSA  Lung nodule seen on imaging study Lung CT with contrast   Statin declined  Follow-up: Return in about 1 year (around 05/20/2024) for chronic fasting follow-up.  An After Visit Summary was printed and given to the patient.  Jettie Pagan [facility] [phone]

## 2023-05-22 LAB — COMPREHENSIVE METABOLIC PANEL
ALT: 17 [IU]/L (ref 0–44)
AST: 22 [IU]/L (ref 0–40)
Albumin: 4.5 g/dL (ref 3.9–4.9)
Alkaline Phosphatase: 47 [IU]/L (ref 44–121)
BUN/Creatinine Ratio: 10 (ref 10–24)
BUN: 10 mg/dL (ref 8–27)
Bilirubin Total: 0.8 mg/dL (ref 0.0–1.2)
CO2: 20 mmol/L (ref 20–29)
Calcium: 9.8 mg/dL (ref 8.6–10.2)
Chloride: 103 mmol/L (ref 96–106)
Creatinine, Ser: 0.97 mg/dL (ref 0.76–1.27)
Globulin, Total: 2.9 g/dL (ref 1.5–4.5)
Glucose: 93 mg/dL (ref 70–99)
Potassium: 4.6 mmol/L (ref 3.5–5.2)
Sodium: 141 mmol/L (ref 134–144)
Total Protein: 7.4 g/dL (ref 6.0–8.5)
eGFR: 85 mL/min/{1.73_m2} (ref >59)

## 2023-05-22 LAB — CBC WITH DIFFERENTIAL/PLATELET
Basophils Absolute: 0.1 10*3/uL (ref 0.0–0.2)
Basos: 1 %
EOS (ABSOLUTE): 0.2 10*3/uL (ref 0.0–0.4)
Eos: 3 %
Hematocrit: 47.6 % (ref 37.5–51.0)
Hemoglobin: 16.2 g/dL (ref 13.0–17.7)
Immature Grans (Abs): 0 10*3/uL (ref 0.0–0.1)
Immature Granulocytes: 1 %
Lymphocytes Absolute: 2.5 10*3/uL (ref 0.7–3.1)
Lymphs: 38 %
MCH: 32.3 pg (ref 26.6–33.0)
MCHC: 34 g/dL (ref 31.5–35.7)
MCV: 95 fL (ref 79–97)
Monocytes Absolute: 0.5 10*3/uL (ref 0.1–0.9)
Monocytes: 8 %
Neutrophils Absolute: 3.3 10*3/uL (ref 1.4–7.0)
Neutrophils: 49 %
Platelets: 254 10*3/uL (ref 150–450)
RBC: 5.01 x10E6/uL (ref 4.14–5.80)
RDW: 12 % (ref 11.6–15.4)
WBC: 6.5 10*3/uL (ref 3.4–10.8)

## 2023-05-22 LAB — PSA: Prostate Specific Ag, Serum: 4.8 ng/mL — ABNORMAL HIGH (ref 0.0–4.0)

## 2023-05-22 LAB — TSH: TSH: 2.36 u[IU]/mL (ref 0.450–4.500)

## 2023-05-22 LAB — LIPID PANEL
Chol/HDL Ratio: 3.7 {ratio} (ref 0.0–5.0)
Cholesterol, Total: 242 mg/dL — ABNORMAL HIGH (ref 100–199)
HDL: 66 mg/dL (ref >39)
LDL Chol Calc (NIH): 154 mg/dL — ABNORMAL HIGH (ref 0–99)
Triglycerides: 127 mg/dL (ref 0–149)
VLDL Cholesterol Cal: 22 mg/dL (ref 5–40)

## 2023-06-17 DIAGNOSIS — R918 Other nonspecific abnormal finding of lung field: Secondary | ICD-10-CM | POA: Diagnosis not present

## 2023-06-18 ENCOUNTER — Encounter: Payer: Self-pay | Admitting: Physician Assistant

## 2023-06-26 ENCOUNTER — Other Ambulatory Visit: Payer: Self-pay | Admitting: Family Medicine

## 2023-09-18 DIAGNOSIS — L578 Other skin changes due to chronic exposure to nonionizing radiation: Secondary | ICD-10-CM | POA: Diagnosis not present

## 2023-09-18 DIAGNOSIS — D225 Melanocytic nevi of trunk: Secondary | ICD-10-CM | POA: Diagnosis not present

## 2023-09-18 DIAGNOSIS — L570 Actinic keratosis: Secondary | ICD-10-CM | POA: Diagnosis not present

## 2023-09-18 DIAGNOSIS — D2239 Melanocytic nevi of other parts of face: Secondary | ICD-10-CM | POA: Diagnosis not present

## 2023-09-18 DIAGNOSIS — L821 Other seborrheic keratosis: Secondary | ICD-10-CM | POA: Diagnosis not present

## 2023-10-14 DIAGNOSIS — H25813 Combined forms of age-related cataract, bilateral: Secondary | ICD-10-CM | POA: Diagnosis not present

## 2023-10-14 DIAGNOSIS — H5203 Hypermetropia, bilateral: Secondary | ICD-10-CM | POA: Diagnosis not present

## 2023-10-14 DIAGNOSIS — H524 Presbyopia: Secondary | ICD-10-CM | POA: Diagnosis not present

## 2023-10-14 DIAGNOSIS — H52223 Regular astigmatism, bilateral: Secondary | ICD-10-CM | POA: Diagnosis not present

## 2023-12-26 ENCOUNTER — Other Ambulatory Visit: Payer: Self-pay | Admitting: Physician Assistant

## 2024-05-12 ENCOUNTER — Ambulatory Visit: Payer: PPO

## 2024-05-18 ENCOUNTER — Ambulatory Visit

## 2024-05-18 ENCOUNTER — Ambulatory Visit (INDEPENDENT_AMBULATORY_CARE_PROVIDER_SITE_OTHER)

## 2024-05-18 VITALS — Ht 67.0 in | Wt 190.0 lb

## 2024-05-18 DIAGNOSIS — Z0000 Encounter for general adult medical examination without abnormal findings: Secondary | ICD-10-CM | POA: Diagnosis not present

## 2024-05-18 NOTE — Patient Instructions (Signed)
 [name] , Thank you for taking time to come for your Medicare Wellness Visit. I appreciate your ongoing commitment to your health goals. Please review the following plan we discussed and let me know if I can assist you in the future.   These are the goals we discussed:  Goals       Patient Stated (pt-stated)      Patient states he would like to make it to next year.         This is a list of the screening recommended for you and due dates:  Health Maintenance  Topic Date Due   Zoster (Shingles) Vaccine (1 of 2) Never done   Flu Shot  12/04/2023   Medicare Annual Wellness Visit  05/18/2025   Colon Cancer Screening  07/09/2026   DTaP/Tdap/Td vaccine (3 - Td or Tdap) 07/31/2030   Pneumococcal Vaccine for age over 19  Completed   Meningitis B Vaccine  Aged Out   COVID-19 Vaccine  Discontinued   Hepatitis C Screening  Discontinued    Advanced directives: Patient states he thinks but is not sure, will ask him wife.    Next appointment: Follow up in one year for your annual wellness visit. 01.20.2027 at 10:00 am (telephone call).  Preventive Care 60 Years and Older, Male  Preventive care refers to lifestyle choices and visits with your health care provider that can promote health and wellness. What does preventive care include? A yearly physical exam. This is also called an annual well check. Dental exams once or twice a year. Routine eye exams. Ask your health care provider how often you should have your eyes checked. Personal lifestyle choices, including: Daily care of your teeth and gums. Regular physical activity. Eating a healthy diet. Avoiding tobacco and drug use. Limiting alcohol use. Practicing safe sex. Taking low doses of aspirin every day. Taking vitamin and mineral supplements as recommended by your health care provider. What happens during an annual well check? The services and screenings done by your health care provider during your annual well check will  depend on your age, overall health, lifestyle risk factors, and family history of disease. Counseling  Your health care provider may ask you questions about your: Alcohol use. Tobacco use. Drug use. Emotional well-being. Home and relationship well-being. Sexual activity. Eating habits. History of falls. Memory and ability to understand (cognition). Work and work astronomer. Screening  You may have the following tests or measurements: Height, weight, and BMI. Blood pressure. Lipid and cholesterol levels. These may be checked every 5 years, or more frequently if you are over 14 years old. Skin check. Lung cancer screening. You may have this screening every year starting at age 32 if you have a 30-pack-year history of smoking and currently smoke or have quit within the past 15 years. Fecal occult blood test (FOBT) of the stool. You may have this test every year starting at age 54. Flexible sigmoidoscopy or colonoscopy. You may have a sigmoidoscopy every 5 years or a colonoscopy every 10 years starting at age 71. Prostate cancer screening. Recommendations will vary depending on your family history and other risks. Hepatitis C blood test. Hepatitis B blood test. Sexually transmitted disease (STD) testing. Diabetes screening. This is done by checking your blood sugar (glucose) after you have not eaten for a while (fasting). You may have this done every 1-3 years. Abdominal aortic aneurysm (AAA) screening. You may need this if you are a current or former smoker. Osteoporosis. You may be  screened starting at age 55 if you are at high risk. Talk with your health care provider about your test results, treatment options, and if necessary, the need for more tests. Vaccines  Your health care provider may recommend certain vaccines, such as: Influenza vaccine. This is recommended every year. Tetanus, diphtheria, and acellular pertussis (Tdap, Td) vaccine. You may need a Td booster every 10  years. Zoster vaccine. You may need this after age 33. Pneumococcal 13-valent conjugate (PCV13) vaccine. One dose is recommended after age 64. Pneumococcal polysaccharide (PPSV23) vaccine. One dose is recommended after age 27. Talk to your health care provider about which screenings and vaccines you need and how often you need them. This information is not intended to replace advice given to you by your health care provider. Make sure you discuss any questions you have with your health care provider. Document Released: 05/18/2015 Document Revised: 01/09/2016 Document Reviewed: 02/20/2015 Elsevier Interactive Patient Education  2017 [employer].  Fall Prevention in the Home Falls can cause injuries. They can happen to people of all ages. There are many things you can do to make your home safe and to help prevent falls. What can I do on the outside of my home? Regularly fix the edges of walkways and driveways and fix any cracks. Remove anything that might make you trip as you walk through a door, such as a raised step or threshold. Trim any bushes or trees on the path to your home. Use bright outdoor lighting. Clear any walking paths of anything that might make someone trip, such as rocks or tools. Regularly check to see if handrails are loose or broken. Make sure that both sides of any steps have handrails. Any raised decks and porches should have guardrails on the edges. Have any leaves, snow, or ice cleared regularly. Use sand or salt on walking paths during winter. Clean up any spills in your garage right away. This includes oil or grease spills. What can I do in the bathroom? Use night lights. Install grab bars by the toilet and in the tub and shower. Do not use towel bars as grab bars. Use non-skid mats or decals in the tub or shower. If you need to sit down in the shower, use a plastic, non-slip stool. Keep the floor dry. Clean up any water that spills on the floor as soon as it  happens. Remove soap buildup in the tub or shower regularly. Attach bath mats securely with double-sided non-slip rug tape. Do not have throw rugs and other things on the floor that can make you trip. What can I do in the bedroom? Use night lights. Make sure that you have a light by your bed that is easy to reach. Do not use any sheets or blankets that are too big for your bed. They should not hang down onto the floor. Have a firm chair that has side arms. You can use this for support while you get dressed. Do not have throw rugs and other things on the floor that can make you trip. What can I do in the kitchen? Clean up any spills right away. Avoid walking on wet floors. Keep items that you use a lot in easy-to-reach places. If you need to reach something above you, use a strong step stool that has a grab bar. Keep electrical cords out of the way. Do not use floor polish or wax that makes floors slippery. If you must use wax, use non-skid floor wax. Do not have  throw rugs and other things on the floor that can make you trip. What can I do with my stairs? Do not leave any items on the stairs. Make sure that there are handrails on both sides of the stairs and use them. Fix handrails that are broken or loose. Make sure that handrails are as long as the stairways. Check any carpeting to make sure that it is firmly attached to the stairs. Fix any carpet that is loose or worn. Avoid having throw rugs at the top or bottom of the stairs. If you do have throw rugs, attach them to the floor with carpet tape. Make sure that you have a light switch at the top of the stairs and the bottom of the stairs. If you do not have them, ask someone to add them for you. What else can I do to help prevent falls? Wear shoes that: Do not have high heels. Have rubber bottoms. Are comfortable and fit you well. Are closed at the toe. Do not wear sandals. If you use a stepladder: Make sure that it is fully opened.  Do not climb a closed stepladder. Make sure that both sides of the stepladder are locked into place. Ask someone to hold it for you, if possible. Clearly mark and make sure that you can see: Any grab bars or handrails. First and last steps. Where the edge of each step is. Use tools that help you move around (mobility aids) if they are needed. These include: Canes. Walkers. Scooters. Crutches. Turn on the lights when you go into a dark area. Replace any light bulbs as soon as they burn out. Set up your furniture so you have a clear path. Avoid moving your furniture around. If any of your floors are uneven, fix them. If there are any pets around you, be aware of where they are. Review your medicines with your doctor. Some medicines can make you feel dizzy. This can increase your chance of falling. Ask your doctor what other things that you can do to help prevent falls. This information is not intended to replace advice given to you by your health care provider. Make sure you discuss any questions you have with your health care provider. Document Released: 02/15/2009 Document Revised: 09/27/2015 Document Reviewed: 05/26/2014 Elsevier Interactive Patient Education  2017 [employer].

## 2024-05-18 NOTE — Progress Notes (Signed)
 "  Chief Complaint  Patient presents with   Annual Exam     Subjective:   Craig Francis is a 69 y.o. male who presents for a Medicare Annual Wellness Visit.  Visit info / Clinical Intake: Medicare Wellness Visit Type:: Subsequent Annual Wellness Visit Persons participating in visit and providing information:: patient Medicare Wellness Visit Mode:: Telephone If telephone:: video error Since this visit was completed virtually, some vitals may be partially provided or unavailable. Missing vitals are due to the limitations of the virtual format.: Documented vitals are patient reported If Telephone or Video please confirm:: I connected with patient using audio/video enable telemedicine. I verified patient identity with two identifiers, discussed telehealth limitations, and patient agreed to proceed. Patient Location:: home Provider Location:: office Interpreter Needed?: No Pre-visit prep was completed: no AWV questionnaire completed by patient prior to visit?: no Living arrangements:: lives with spouse/significant other Patient's Overall Health Status Rating: excellent Typical amount of pain: none Does pain affect daily life?: no Are you currently prescribed opioids?: no  Dietary Habits and Nutritional Risks How many meals a day?: 3 Eats fruit and vegetables daily?: yes Most meals are obtained by: preparing own meals In the last 2 weeks, have you had any of the following?: none Diabetic:: no  Functional Status Activities of Daily Living (to include ambulation/medication): Independent Ambulation: Independent Medication Administration: Independent Home Management (perform basic housework or laundry): Independent Manage your own finances?: yes Primary transportation is: driving Concerns about vision?: no *vision screening is required for WTM* Concerns about hearing?: no  Fall Screening Falls in the past year?: 0 Number of falls in past year: 0 Was there an injury with  Fall?: 0 Fall Risk Category Calculator: 0 Patient Fall Risk Level: Low Fall Risk  Fall Risk Patient at Risk for Falls Due to: No Fall Risks Fall risk Follow up: Falls evaluation completed  Home and Transportation Safety: All rugs have non-skid backing?: yes All stairs or steps have railings?: yes Grab bars in the bathtub or shower?: (!) no Have non-skid surface in bathtub or shower?: yes Good home lighting?: yes Regular seat belt use?: yes Hospital stays in the last year:: no  Cognitive Assessment Difficulty concentrating, remembering, or making decisions? : no Will 6CIT or Mini Cog be Completed: yes What year is it?: 0 points What month is it?: 0 points Give patient an address phrase to remember (5 components): [address] [place] About what time is it?: 0 points Count backwards from 20 to 1: 0 points Say the months of the year in reverse: 2 points Repeat the address phrase from earlier: 2 points 6 CIT Score: 4 points  Advance Directives (For Healthcare) Does Patient Have a Medical Advance Directive?: No Would patient like information on creating a medical advance directive?: No - Patient declined  Reviewed/Updated  Reviewed/Updated: Reviewed All (Medical, Surgical, Family, Medications, Allergies, Care Teams, Patient Goals)    Allergies (verified) Patient has no known allergies.   Current Medications (verified) Outpatient Encounter Medications as of 05/18/2024  Medication Sig   levothyroxine  (SYNTHROID ) 75 MCG tablet TAKE 1 TABLET BY MOUTH EVERY DAY BEFORE BREAKFAST.   [DISCONTINUED] Multiple Vitamins-Minerals (CENTRUM ADULT PO) Take by mouth. (Patient not taking: Reported on 05/18/2024)   No facility-administered encounter medications on file as of 05/18/2024.    History: Past Medical History:  Diagnosis Date   Hypothyroid    Past Surgical History:  Procedure Laterality Date   APPENDECTOMY     BACK SURGERY  MOUTH SURGERY     SHOULDER SURGERY      Family History  Problem Relation Age of Onset   Heart disease Other    Social History   Occupational History   Not on file  Tobacco Use   Smoking status: Never   Smokeless tobacco: Never  Vaping Use   Vaping status: Never Used  Substance and Sexual Activity   Alcohol use: Yes    Alcohol/week: 1.0 standard drink of alcohol    Types: 1 Cans of beer per week    Comment: occasional   Drug use: Never   Sexual activity: Not on file   Tobacco Counseling Counseling given: Not Answered  SDOH Screenings   Food Insecurity: No Food Insecurity (05/18/2024)  Housing: Unknown (05/18/2024)  Transportation Needs: No Transportation Needs (05/18/2024)  Utilities: Not At Risk (05/18/2024)  Alcohol Screen: Low Risk (05/12/2023)  Depression (PHQ2-9): Low Risk (05/18/2024)  Financial Resource Strain: Low Risk (05/12/2023)  Physical Activity: Sufficiently Active (05/18/2024)  Social Connections: Moderately Isolated (05/18/2024)  Stress: No Stress Concern Present (05/18/2024)  Tobacco Use: Low Risk (05/18/2024)  Health Literacy: Adequate Health Literacy (05/18/2024)   See flowsheets for full screening details  Depression Screen PHQ 2 & 9 Depression Scale- Over the past 2 weeks, how often have you been bothered by any of the following problems? Little interest or pleasure in doing things: 0 Feeling down, depressed, or hopeless (PHQ Adolescent also includes...irritable): 0 PHQ-2 Total Score: 0 Trouble falling or staying asleep, or sleeping too much: 0 Feeling tired or having little energy: 0 Poor appetite or overeating (PHQ Adolescent also includes...weight loss): 0 Feeling bad about yourself - or that you are a failure or have let yourself or your family down: 0 Trouble concentrating on things, such as reading the newspaper or watching television (PHQ Adolescent also includes...like school work): 0 Moving or speaking so slowly that other people could have noticed. Or the opposite - being so fidgety or  restless that you have been moving around a lot more than usual: 0 Thoughts that you would be better off dead, or of hurting yourself in some way: 0 PHQ-9 Total Score: 0 If you checked off any problems, how difficult have these problems made it for you to do your work, take care of things at home, or get along with other people?: Not difficult at all  Depression Treatment Depression Interventions/Treatment : EYV7-0 Score <4 Follow-up Not Indicated     Goals Addressed               This Visit's Progress     Patient Stated (pt-stated)        Patient states he would like to make it to next year.              Objective:    Today's Vitals   05/18/24 1003  Weight: 190 lb (86.2 kg)  Height: 5' 7 (1.702 m)   Body mass index is 29.76 kg/m.  Hearing/Vision screen No results found. Immunizations and Health Maintenance Health Maintenance  Topic Date Due   Zoster Vaccines- Shingrix (1 of 2) Never done   Influenza Vaccine  12/04/2023   Medicare Annual Wellness (AWV)  05/18/2025   Colonoscopy  07/09/2026   DTaP/Tdap/Td (3 - Td or Tdap) 07/31/2030   Pneumococcal Vaccine: 50+ Years  Completed   Meningococcal B Vaccine  Aged Out   COVID-19 Vaccine  Discontinued   Hepatitis C Screening  Discontinued        Assessment/Plan:  This is a routine wellness examination for Craig Francis.  Patient Care Team: Nicholaus Credit, DEVONNA as PCP - General (Physician Assistant)  I have personally reviewed and noted the following in the patients chart:   Medical and social history Use of alcohol, tobacco or illicit drugs  Current medications and supplements including opioid prescriptions. Functional ability and status Nutritional status Physical activity Advanced directives List of other physicians Hospitalizations, surgeries, and ER visits in previous 12 months Vitals Screenings to include cognitive, depression, and falls Referrals and appointments  No orders of the defined types were  placed in this encounter.  In addition, I have reviewed and discussed with patient certain preventive protocols, quality metrics, and best practice recommendations. A written personalized care plan for preventive services as well as general preventive health recommendations were provided to patient.   [place] Mailman, [place]   05/18/2024   Return in 1 year (on 05/18/2025).  After Visit Summary: (In Person-Declined) Patient declined AVS at this time.  Nurse Notes: I spent 20 minutes on the phone with patient. Patient has no questions or concerns at this time.  "

## 2024-05-23 ENCOUNTER — Encounter: Payer: Self-pay | Admitting: Physician Assistant

## 2024-05-23 ENCOUNTER — Ambulatory Visit (INDEPENDENT_AMBULATORY_CARE_PROVIDER_SITE_OTHER): Payer: PPO | Admitting: Physician Assistant

## 2024-05-23 VITALS — BP 112/68 | HR 99 | Temp 97.5°F | Resp 18 | Ht 65.5 in | Wt 186.8 lb

## 2024-05-23 DIAGNOSIS — R972 Elevated prostate specific antigen [PSA]: Secondary | ICD-10-CM | POA: Diagnosis not present

## 2024-05-23 DIAGNOSIS — Z5320 Procedure and treatment not carried out because of patient's decision for unspecified reasons: Secondary | ICD-10-CM | POA: Diagnosis not present

## 2024-05-23 DIAGNOSIS — E038 Other specified hypothyroidism: Secondary | ICD-10-CM | POA: Diagnosis not present

## 2024-05-23 DIAGNOSIS — E782 Mixed hyperlipidemia: Secondary | ICD-10-CM

## 2024-05-23 NOTE — Progress Notes (Signed)
 "  Subjective:  Patient ID: Craig Francis, male    DOB: 1955-11-10  Age: 69 y.o. MRN: [id number]  Chief Complaint  Patient presents with   Medical Management of Chronic Issues    HPI Pt with hypothyroidism - currently on synthroid  75mcg qd. Voices no concerns or problems - due for labwork  Pt does have hyperlipidemia and aortic atherosclerosis - he declines statin treatment - states he 'feels fine' and has no issues and does not want to start medication at this time  Pt with elevated PSA - was advised to follow up with urologist last year but pt did not schedule appt - states he is having no issues but explained elevated PSA could be sign of enlarged prostate, prostate cancer, etc.  Will repeat PSA today and pt will decide if he will see urologist based on results  Pt had chest CT in 1/23 that showed new pulmonary nodules but repeat chest CT in 2/25 showed no nodules.  He does not want to repeat chest CT at this time     05/18/2024   10:10 AM 05/21/2023   10:15 AM 05/12/2023    1:50 PM 05/07/2022    8:57 AM 05/01/2021    8:12 AM  Depression screen PHQ 2/9  Decreased Interest 0 0 0 0 0  Down, Depressed, Hopeless 0 0 0 0 0  PHQ - 2 Score 0 0 0 0 0  Altered sleeping 0      Tired, decreased energy 0      Change in appetite 0      Feeling bad or failure about yourself  0      Trouble concentrating 0      Moving slowly or fidgety/restless 0      Suicidal thoughts 0      PHQ-9 Score 0      Difficult doing work/chores Not difficult at all            04/26/2020    8:37 AM 05/01/2021    8:15 AM 05/07/2022    8:59 AM 05/12/2023    1:49 PM 05/18/2024   10:05 AM  Fall Risk  Falls in the past year? 0 0 0 0 0  Was there an injury with Fall? 0  0  0  0  0  Fall Risk Category Calculator 0 0 0 0 0  Fall Risk Category (Retired) Low  Low  Low     (RETIRED) Patient Fall Risk Level Low fall risk   Low fall risk     Patient at Risk for Falls Due to No Fall Risks No Fall Risks No Fall Risks  No  Fall Risks  Fall risk Follow up  Falls evaluation completed;Falls prevention discussed  Falls evaluation completed   Falls evaluation completed     Data saved with a previous flowsheet row definition    CONSTITUTIONAL: Negative for chills, fatigue, fever, unintentional weight gain and unintentional weight loss.  E/N/T: Negative for ear pain, nasal congestion and sore throat.  CARDIOVASCULAR: Negative for chest pain, dizziness, palpitations and pedal edema.  RESPIRATORY: Negative for recent cough and dyspnea.  GASTROINTESTINAL: Negative for abdominal pain, acid reflux symptoms, constipation, diarrhea, nausea and vomiting.  MSK: Negative for arthralgias and myalgias.  INTEGUMENTARY: Negative for rash.  NEUROLOGICAL: Negative for dizziness and headaches.  PSYCHIATRIC: Negative for sleep disturbance and to question depression screen.  Negative for depression, negative for anhedonia.       Current Outpatient Medications:    levothyroxine  (  SYNTHROID ) 75 MCG tablet, TAKE 1 TABLET BY MOUTH EVERY DAY BEFORE BREAKFAST., Disp: 90 tablet, Rfl: 1  Past Medical History:  Diagnosis Date   Hypothyroid    Objective:  PHYSICAL EXAM:   VS: BP 112/68   Pulse 99   Temp (!) 97.5 F (36.4 C) (Temporal)   Resp 18   Ht 5' 5.5 (1.664 m)   Wt 186 lb 12.8 oz (84.7 kg)   SpO2 94%   BMI 30.61 kg/m   GEN: Well nourished, well developed, in no acute distress  Cardiac: RRR; no murmurs, rubs, or gallops,no edema -  Respiratory:  normal respiratory rate and pattern with no distress - normal breath sounds with no rales, rhonchi, wheezes or rubs MS: no deformity or atrophy  Skin: warm and dry, no rash  Neuro:  Alert and Oriented x 3,  - CN II-Xii grossly intact Psych: euthymic mood, appropriate affect and demeanor   Assessment & Plan:    Adult onset hypothyroidism -     TSH Continue levothyroxine  75mcg qd Mixed hyperlipidemia -     CBC with Differential/Platelet -     Comprehensive metabolic  panel -     Lipid panel Watch diet Elevated PSA -     PSA Recommend urology referral - pt has declined  Statin declined  Follow-up: Return in about 1 year (around 05/23/2025) for chronic fasting follow-up.  An After Visit Summary was printed and given to the patient.  CAMIE JONELLE NICHOLAUS DEVONNA [facility] [phone] "

## 2024-05-24 ENCOUNTER — Ambulatory Visit: Payer: Self-pay | Admitting: Physician Assistant

## 2024-05-24 ENCOUNTER — Other Ambulatory Visit: Payer: Self-pay | Admitting: Physician Assistant

## 2024-05-24 DIAGNOSIS — R972 Elevated prostate specific antigen [PSA]: Secondary | ICD-10-CM

## 2024-05-24 LAB — TSH: TSH: 1.13 u[IU]/mL (ref 0.450–4.500)

## 2024-05-24 LAB — CBC WITH DIFFERENTIAL/PLATELET
Basophils Absolute: 0.1 x10E3/uL (ref 0.0–0.2)
Basos: 1 %
EOS (ABSOLUTE): 0.2 x10E3/uL (ref 0.0–0.4)
Eos: 3 %
Hematocrit: 47.4 % (ref 37.5–51.0)
Hemoglobin: 16.2 g/dL (ref 13.0–17.7)
Immature Grans (Abs): 0 x10E3/uL (ref 0.0–0.1)
Immature Granulocytes: 0 %
Lymphocytes Absolute: 1.8 x10E3/uL (ref 0.7–3.1)
Lymphs: 22 %
MCH: 31.9 pg (ref 26.6–33.0)
MCHC: 34.2 g/dL (ref 31.5–35.7)
MCV: 93 fL (ref 79–97)
Monocytes Absolute: 0.5 x10E3/uL (ref 0.1–0.9)
Monocytes: 6 %
Neutrophils Absolute: 5.8 x10E3/uL (ref 1.4–7.0)
Neutrophils: 68 %
Platelets: 282 x10E3/uL (ref 150–450)
RBC: 5.08 x10E6/uL (ref 4.14–5.80)
RDW: 12.2 % (ref 11.6–15.4)
WBC: 8.5 x10E3/uL (ref 3.4–10.8)

## 2024-05-24 LAB — LIPID PANEL
Chol/HDL Ratio: 3.4 ratio (ref 0.0–5.0)
Cholesterol, Total: 214 mg/dL — ABNORMAL HIGH (ref 100–199)
HDL: 63 mg/dL
LDL Chol Calc (NIH): 123 mg/dL — ABNORMAL HIGH (ref 0–99)
Triglycerides: 160 mg/dL — ABNORMAL HIGH (ref 0–149)
VLDL Cholesterol Cal: 28 mg/dL (ref 5–40)

## 2024-05-24 LAB — COMPREHENSIVE METABOLIC PANEL WITH GFR
ALT: 13 IU/L (ref 0–44)
AST: 18 IU/L (ref 0–40)
Albumin: 4.5 g/dL (ref 3.9–4.9)
Alkaline Phosphatase: 49 IU/L (ref 47–123)
BUN/Creatinine Ratio: 11 (ref 10–24)
BUN: 11 mg/dL (ref 8–27)
Bilirubin Total: 0.7 mg/dL (ref 0.0–1.2)
CO2: 21 mmol/L (ref 20–29)
Calcium: 9.3 mg/dL (ref 8.6–10.2)
Chloride: 103 mmol/L (ref 96–106)
Creatinine, Ser: 0.97 mg/dL (ref 0.76–1.27)
Globulin, Total: 2.6 g/dL (ref 1.5–4.5)
Glucose: 95 mg/dL (ref 70–99)
Potassium: 4.3 mmol/L (ref 3.5–5.2)
Sodium: 141 mmol/L (ref 134–144)
Total Protein: 7.1 g/dL (ref 6.0–8.5)
eGFR: 84 mL/min/1.73

## 2024-05-24 LAB — PSA: Prostate Specific Ag, Serum: 4.6 ng/mL — ABNORMAL HIGH (ref 0.0–4.0)

## 2025-05-24 ENCOUNTER — Ambulatory Visit

## 2025-05-25 ENCOUNTER — Ambulatory Visit: Admitting: Physician Assistant
